# Patient Record
Sex: Female | Born: 1980 | Race: White | Hispanic: No | Marital: Married | Smoking: Never smoker
Health system: Southern US, Community
[De-identification: ages and names within clinical notes are randomized; demographics above are authoritative.]

## PROBLEM LIST (undated history)

## (undated) DIAGNOSIS — E669 Obesity, unspecified: Secondary | ICD-10-CM

## (undated) DIAGNOSIS — IMO0001 Reserved for inherently not codable concepts without codable children: Secondary | ICD-10-CM

## (undated) HISTORY — PX: TUBAL LIGATION: SHX77

## (undated) HISTORY — PX: HERNIA REPAIR: SHX51

## (undated) HISTORY — PX: ABDOMINAL SURGERY: SHX537

## (undated) HISTORY — PX: ABDOMINAL HYSTERECTOMY: SHX81

## (undated) HISTORY — PX: CHOLECYSTECTOMY: SHX55

## (undated) HISTORY — PX: TONSILLECTOMY: SUR1361

---

## 2005-04-19 ENCOUNTER — Emergency Department: Payer: Self-pay | Admitting: Emergency Medicine

## 2005-11-23 ENCOUNTER — Emergency Department: Payer: Self-pay | Admitting: Emergency Medicine

## 2005-11-25 ENCOUNTER — Emergency Department: Payer: Self-pay | Admitting: Internal Medicine

## 2005-11-26 ENCOUNTER — Ambulatory Visit: Payer: Self-pay | Admitting: Internal Medicine

## 2005-11-27 ENCOUNTER — Ambulatory Visit: Payer: Self-pay | Admitting: Internal Medicine

## 2006-01-09 ENCOUNTER — Ambulatory Visit: Payer: Self-pay | Admitting: Obstetrics and Gynecology

## 2006-01-23 ENCOUNTER — Ambulatory Visit: Payer: Self-pay | Admitting: Obstetrics and Gynecology

## 2006-03-13 ENCOUNTER — Emergency Department: Payer: Self-pay | Admitting: Internal Medicine

## 2006-05-12 ENCOUNTER — Observation Stay: Payer: Self-pay | Admitting: Obstetrics and Gynecology

## 2006-05-13 ENCOUNTER — Inpatient Hospital Stay: Payer: Self-pay | Admitting: Obstetrics and Gynecology

## 2006-06-13 ENCOUNTER — Ambulatory Visit: Payer: Self-pay | Admitting: Obstetrics and Gynecology

## 2006-06-14 ENCOUNTER — Ambulatory Visit: Payer: Self-pay | Admitting: Obstetrics and Gynecology

## 2006-06-17 ENCOUNTER — Inpatient Hospital Stay: Payer: Self-pay | Admitting: Obstetrics and Gynecology

## 2007-04-19 ENCOUNTER — Emergency Department: Payer: Self-pay | Admitting: Emergency Medicine

## 2007-07-06 ENCOUNTER — Encounter: Payer: Self-pay | Admitting: Maternal & Fetal Medicine

## 2008-02-22 ENCOUNTER — Other Ambulatory Visit: Payer: Self-pay

## 2008-02-22 ENCOUNTER — Emergency Department: Payer: Self-pay | Admitting: Emergency Medicine

## 2008-04-10 ENCOUNTER — Emergency Department: Payer: Self-pay | Admitting: Emergency Medicine

## 2008-04-11 ENCOUNTER — Ambulatory Visit: Payer: Self-pay | Admitting: Emergency Medicine

## 2009-02-26 ENCOUNTER — Emergency Department: Payer: Self-pay | Admitting: Emergency Medicine

## 2009-04-14 ENCOUNTER — Ambulatory Visit: Payer: Self-pay | Admitting: Urology

## 2013-01-13 ENCOUNTER — Emergency Department: Payer: Self-pay | Admitting: Emergency Medicine

## 2013-01-13 LAB — CBC
HGB: 15.5 g/dL (ref 12.0–16.0)
MCHC: 34.5 g/dL (ref 32.0–36.0)
Platelet: 320 10*3/uL (ref 150–440)
RDW: 13.8 % (ref 11.5–14.5)

## 2013-01-13 LAB — URINALYSIS, COMPLETE
Bilirubin,UR: NEGATIVE
Glucose,UR: NEGATIVE mg/dL (ref 0–75)
Ketone: NEGATIVE
Ph: 5 (ref 4.5–8.0)
Protein: 30
Squamous Epithelial: 20
WBC UR: 42 /HPF (ref 0–5)

## 2013-01-13 LAB — COMPREHENSIVE METABOLIC PANEL
Albumin: 4 g/dL (ref 3.4–5.0)
Alkaline Phosphatase: 90 U/L (ref 50–136)
BUN: 8 mg/dL (ref 7–18)
Bilirubin,Total: 0.3 mg/dL (ref 0.2–1.0)
Calcium, Total: 8.9 mg/dL (ref 8.5–10.1)
Chloride: 108 mmol/L — ABNORMAL HIGH (ref 98–107)
Co2: 29 mmol/L (ref 21–32)
Creatinine: 0.79 mg/dL (ref 0.60–1.30)
EGFR (African American): >60
EGFR (Non-African Amer.): >60
Glucose: 88 mg/dL (ref 65–99)
Sodium: 140 mmol/L (ref 136–145)
Total Protein: 8.1 g/dL (ref 6.4–8.2)

## 2013-01-13 LAB — PREGNANCY, URINE: Pregnancy Test, Urine: NEGATIVE m[IU]/mL

## 2013-02-09 ENCOUNTER — Emergency Department: Payer: Self-pay | Admitting: Emergency Medicine

## 2013-03-25 ENCOUNTER — Emergency Department: Payer: Self-pay | Admitting: Emergency Medicine

## 2013-04-08 ENCOUNTER — Ambulatory Visit: Payer: Self-pay

## 2013-04-14 ENCOUNTER — Emergency Department: Payer: Self-pay | Admitting: Emergency Medicine

## 2013-04-14 LAB — COMPREHENSIVE METABOLIC PANEL
Albumin: 3.7 g/dL (ref 3.4–5.0)
Alkaline Phosphatase: 71 U/L (ref 50–136)
BUN: 14 mg/dL (ref 7–18)
Co2: 27 mmol/L (ref 21–32)
Creatinine: 0.87 mg/dL (ref 0.60–1.30)
EGFR (African American): >60
EGFR (Non-African Amer.): >60
Osmolality: 279 (ref 275–301)
Potassium: 4 mmol/L (ref 3.5–5.1)
SGOT(AST): 16 U/L (ref 15–37)
SGPT (ALT): 25 U/L (ref 12–78)

## 2013-04-14 LAB — CBC
MCHC: 34.2 g/dL (ref 32.0–36.0)
MCV: 88 fL (ref 80–100)
Platelet: 298 10*3/uL (ref 150–440)
RBC: 4.73 10*6/uL (ref 3.80–5.20)
RDW: 14 % (ref 11.5–14.5)

## 2013-04-15 ENCOUNTER — Emergency Department: Payer: Self-pay | Admitting: Emergency Medicine

## 2013-09-03 ENCOUNTER — Emergency Department: Payer: Self-pay | Admitting: Emergency Medicine

## 2013-09-03 LAB — URINALYSIS, COMPLETE
Bilirubin,UR: NEGATIVE
Blood: NEGATIVE
Glucose,UR: NEGATIVE mg/dL (ref 0–75)
Ketone: NEGATIVE
Ph: 6 (ref 4.5–8.0)
Protein: NEGATIVE
RBC,UR: 3 /HPF (ref 0–5)
Specific Gravity: 1.018 (ref 1.003–1.030)
Squamous Epithelial: 5
WBC UR: 7 /HPF (ref 0–5)

## 2013-09-03 LAB — COMPREHENSIVE METABOLIC PANEL
Alkaline Phosphatase: 80 U/L (ref 50–136)
Anion Gap: 2 — ABNORMAL LOW (ref 7–16)
BUN: 10 mg/dL (ref 7–18)
Bilirubin,Total: 0.3 mg/dL (ref 0.2–1.0)
Co2: 30 mmol/L (ref 21–32)
EGFR (African American): >60
EGFR (Non-African Amer.): >60
Glucose: 87 mg/dL (ref 65–99)
Osmolality: 274 (ref 275–301)
SGOT(AST): 18 U/L (ref 15–37)
SGPT (ALT): 25 U/L (ref 12–78)
Sodium: 138 mmol/L (ref 136–145)
Total Protein: 7.5 g/dL (ref 6.4–8.2)

## 2013-09-03 LAB — CBC
HGB: 14.2 g/dL (ref 12.0–16.0)
MCHC: 34.7 g/dL (ref 32.0–36.0)
MCV: 88 fL (ref 80–100)
Platelet: 300 10*3/uL (ref 150–440)
RDW: 13.4 % (ref 11.5–14.5)
WBC: 12.7 10*3/uL — ABNORMAL HIGH (ref 3.6–11.0)

## 2013-11-29 ENCOUNTER — Ambulatory Visit: Payer: Self-pay | Admitting: Physician Assistant

## 2013-11-29 LAB — URINALYSIS, COMPLETE
Ketone: NEGATIVE
Ph: 8 (ref 4.5–8.0)
Protein: 30
Specific Gravity: 1.015 (ref 1.003–1.030)
Squamous Epithelial: >30

## 2014-06-05 ENCOUNTER — Ambulatory Visit: Payer: Self-pay | Admitting: Physician Assistant

## 2014-08-23 ENCOUNTER — Emergency Department: Payer: Self-pay | Admitting: Emergency Medicine

## 2014-08-23 LAB — COMPREHENSIVE METABOLIC PANEL
Albumin: 3.8 g/dL (ref 3.4–5.0)
Alkaline Phosphatase: 68 U/L
Anion Gap: 7 (ref 7–16)
BUN: 8 mg/dL (ref 7–18)
Bilirubin,Total: 0.4 mg/dL (ref 0.2–1.0)
Calcium, Total: 8.8 mg/dL (ref 8.5–10.1)
Chloride: 107 mmol/L (ref 98–107)
Co2: 26 mmol/L (ref 21–32)
Creatinine: 0.93 mg/dL (ref 0.60–1.30)
EGFR (African American): >60
EGFR (Non-African Amer.): >60
Glucose: 94 mg/dL (ref 65–99)
OSMOLALITY: 277 (ref 275–301)
POTASSIUM: 4.1 mmol/L (ref 3.5–5.1)
SGOT(AST): 14 U/L — ABNORMAL LOW (ref 15–37)
SGPT (ALT): 22 U/L
Sodium: 140 mmol/L (ref 136–145)
Total Protein: 7.7 g/dL (ref 6.4–8.2)

## 2014-08-23 LAB — CBC WITH DIFFERENTIAL/PLATELET
Basophil #: 0 10*3/uL (ref 0.0–0.1)
Basophil %: 0.2 %
EOS PCT: 0.6 %
Eosinophil #: 0.1 10*3/uL (ref 0.0–0.7)
HCT: 44 % (ref 35.0–47.0)
HGB: 15 g/dL (ref 12.0–16.0)
LYMPHS ABS: 2.4 10*3/uL (ref 1.0–3.6)
LYMPHS PCT: 22.7 %
MCH: 30.3 pg (ref 26.0–34.0)
MCHC: 34.1 g/dL (ref 32.0–36.0)
MCV: 89 fL (ref 80–100)
Monocyte #: 0.6 x10 3/mm (ref 0.2–0.9)
Monocyte %: 5.8 %
NEUTROS ABS: 7.5 10*3/uL — AB (ref 1.4–6.5)
Neutrophil %: 70.7 %
Platelet: 322 10*3/uL (ref 150–440)
RBC: 4.96 10*6/uL (ref 3.80–5.20)
RDW: 14 % (ref 11.5–14.5)
WBC: 10.5 10*3/uL (ref 3.6–11.0)

## 2014-08-23 LAB — URINALYSIS, COMPLETE
Bacteria: NONE SEEN
Glucose,UR: NEGATIVE mg/dL (ref 0–75)
NITRITE: NEGATIVE
Ph: 5 (ref 4.5–8.0)
Protein: NEGATIVE
RBC,UR: 14 /HPF (ref 0–5)
SPECIFIC GRAVITY: 1.021 (ref 1.003–1.030)
Squamous Epithelial: 37
WBC UR: 33 /HPF (ref 0–5)

## 2015-01-10 ENCOUNTER — Ambulatory Visit: Payer: Self-pay | Admitting: Obstetrics & Gynecology

## 2015-01-10 LAB — CBC
HCT: 42.8 % (ref 35.0–47.0)
HGB: 14.3 g/dL (ref 12.0–16.0)
MCH: 30.4 pg (ref 26.0–34.0)
MCHC: 33.5 g/dL (ref 32.0–36.0)
MCV: 91 fL (ref 80–100)
PLATELETS: 298 10*3/uL (ref 150–440)
RBC: 4.72 10*6/uL (ref 3.80–5.20)
RDW: 13.5 % (ref 11.5–14.5)
WBC: 9.1 10*3/uL (ref 3.6–11.0)

## 2015-01-17 ENCOUNTER — Ambulatory Visit: Payer: Self-pay | Admitting: Obstetrics & Gynecology

## 2015-01-18 LAB — HEMOGLOBIN: HGB: 13.2 g/dL (ref 12.0–16.0)

## 2015-01-27 ENCOUNTER — Ambulatory Visit: Payer: Self-pay | Admitting: Obstetrics & Gynecology

## 2015-04-17 LAB — SURGICAL PATHOLOGY

## 2015-04-23 NOTE — Op Note (Signed)
PATIENT NAME:  Mercedes [name], Mercedes [name] MR#:  [id number] DATE OF BIRTH:  1981-10-07  DATE OF PROCEDURE:  01/17/2015  PREOPERATIVE DIAGNOSIS:  Menometrorrhagia.    POSTOPERATIVE DIAGNOSIS:  Menometrorrhagia.    PROCEDURE:  Laparoscopic supracervical hysterectomy with bilateral salpingectomy.    SURGEON:  Dierdre Searles. Paul Destine Zirkle, MD.    ASSISTANT:  Dr. Vergie LivingPickens.    ANESTHESIA:  General.    ESTIMATED BLOOD LOSS:  25 mL.    COMPLICATIONS:  None.    FINDINGS:  There was a significant amount of adhesions of the bladder to the lower uterine segment and cervix.  The patient had normal ovaries.    DISPOSITION: To recovery in stable condition.    TECHNIQUE:  The patient is prepped and draped in the usual sterile fashion after adequate anesthesia is obtained in the dorsal lithotomy position.  Foley catheter is inserted.  Sponge stick is placed in the vagina for manipulation purposes.    Attention is then turned to the abdomen where a Veress needle is inserted through a 5 mm infraumbilical incision after Marcaine was used to anesthetize the skin. Veress needle placement is confirmed using the hanging drop technique and the abdomen is then insufflated with CO2 gas.  A 5 mm trocar is then inserted under direct visualization with laparoscope with no injuries or bleeding noted.  The patient is placed in Trendelenburg position with the abovementioned findings visualized.    An 11 mm trocar is placed in the right lower quadrant and a 5 mm trocar is placed in the left lower quadrant lateral to the inferior epigastric blood vessels with no injuries or bleeding noted.  There are adhesions along the anterior abdominal wall with the omentum and these are carefully lysed using the 5 mm Harmonic scalpel.  Examination of small bowel and colon reveals no injuries or problems.  The round ligaments are carefully clamped, transected with the 5 mm Harmonic scalpel device to obtain coagulation at the same time.  The anterior and posterior  leaflets of the broad ligament are carefully dissected free down to the level of the uteroovarian blood vessels superiorly with careful transection of this ligament and blood vessels with the 5 mm Harmonic scalpel without any bleeding noted.  The ovary and its blood supply is preserved on each side.  Then dissection is carried down in an inferior fashion, careful dissection to stay above the level of the bladder where it is adherent to the cervix and lower uterine segment is performed.  The uterine arteries are carefully coagulated and cut using the bipolar cautery device.  Once adequate dissection is performed the uterus is amputated with approximately 1-2 cm of cervix left in place.  The endocervical canal is carefully cauterized with both the Harmonic scalpel and the bipolar cautery device.  Excellent hemostasis is noted at the cervical site.    Examination of the pelvis reveals no bleeding.  Ureters are visualized on each side, there are no apparent injuries to bowel, bladder, ureter, or other structures.  The pelvic cavity is irrigated with aspiration of all fluid.  Intercede is placed over the cervical stump.    The right lower quadrant trocar is removed and a morcellator device is place.  The uterus is removed in several pieces using the morcellator device.  Re-exam of the pelvis reveals no pieces of tissue left behind. Pelvic cavity is irrigated again with aspiration of all fluid.  The morcellator is then removed and the fascia at this site is closed with a 0 Vicryl  suture using the fascial closure device.  Gas is then expelled from the abdomen as the patient is leveled and then all trocars are removed.  The skin is closed with Dermabond at all sites.  Sponge stick is removed from the vagina.  The patient goes to recovery room in stable condition.  All sponge, instrument, and needle counts are correct at the conclusion of the case.      ____________________________ R. Annamarie Major,  MD rph:bu D: 01/17/2015 15:33:44 ET T: 01/17/2015 17:15:14 ET JOB#: [id number]  cc: Dierdre Searles, MD, <Dictator> Nadara Mustard MD ELECTRONICALLY SIGNED 01/18/2015 7:42

## 2015-06-03 ENCOUNTER — Encounter: Payer: Self-pay | Admitting: *Deleted

## 2015-06-03 DIAGNOSIS — S6991XA Unspecified injury of right wrist, hand and finger(s), initial encounter: Secondary | ICD-10-CM | POA: Diagnosis not present

## 2015-06-03 DIAGNOSIS — Z90710 Acquired absence of both cervix and uterus: Secondary | ICD-10-CM | POA: Diagnosis not present

## 2015-06-03 DIAGNOSIS — Y92007 Garden or yard of unspecified non-institutional (private) residence as the place of occurrence of the external cause: Secondary | ICD-10-CM | POA: Diagnosis not present

## 2015-06-03 DIAGNOSIS — T148 Other injury of unspecified body region: Secondary | ICD-10-CM | POA: Insufficient documentation

## 2015-06-03 DIAGNOSIS — Y998 Other external cause status: Secondary | ICD-10-CM | POA: Diagnosis not present

## 2015-06-03 DIAGNOSIS — Y93H2 Activity, gardening and landscaping: Secondary | ICD-10-CM | POA: Diagnosis not present

## 2015-06-03 DIAGNOSIS — R1031 Right lower quadrant pain: Secondary | ICD-10-CM | POA: Diagnosis present

## 2015-06-03 DIAGNOSIS — X58XXXA Exposure to other specified factors, initial encounter: Secondary | ICD-10-CM | POA: Diagnosis not present

## 2015-06-03 NOTE — ED Notes (Signed)
Pt c/o RLQ abdominal pain worsening tonight while mowing. Pt states she felt "something pop" in RLQ. Pt has recent hx of abdominal pain related to moving a couch x 3 days ago but did not get bad enough for her to be seen after she pulled on the mower tonight. Pt denies n/v/d and dysuria. Pt c/o tenderness to palpation in RLQ. Pt has hx of partial hysterectomy in past 6 months. Pt denies vaginal bleeding at this time.

## 2015-06-04 ENCOUNTER — Emergency Department: Payer: Medicaid Other

## 2015-06-04 ENCOUNTER — Emergency Department
Admission: EM | Admit: 2015-06-04 | Discharge: 2015-06-04 | Disposition: A | Discharge status: Home / Self Care | Payer: Medicaid Other | Attending: Student | Admitting: Student

## 2015-06-04 DIAGNOSIS — R1031 Right lower quadrant pain: Secondary | ICD-10-CM

## 2015-06-04 DIAGNOSIS — T148XXA Other injury of unspecified body region, initial encounter: Secondary | ICD-10-CM

## 2015-06-04 LAB — CBC WITH DIFFERENTIAL/PLATELET
BASOS PCT: 0 %
Basophils Absolute: 0 10*3/uL (ref 0–0.1)
Eosinophils Absolute: 0.1 10*3/uL (ref 0–0.7)
Eosinophils Relative: 1 %
HEMATOCRIT: 42.3 % (ref 35.0–47.0)
HEMOGLOBIN: 14.3 g/dL (ref 12.0–16.0)
LYMPHS ABS: 3.1 10*3/uL (ref 1.0–3.6)
Lymphocytes Relative: 26 %
MCH: 29.8 pg (ref 26.0–34.0)
MCHC: 33.8 g/dL (ref 32.0–36.0)
MCV: 88.3 fL (ref 80.0–100.0)
MONO ABS: 0.8 10*3/uL (ref 0.2–0.9)
Monocytes Relative: 7 %
NEUTROS ABS: 7.9 10*3/uL — AB (ref 1.4–6.5)
Neutrophils Relative %: 66 %
Platelets: 325 10*3/uL (ref 150–440)
RBC: 4.79 MIL/uL (ref 3.80–5.20)
RDW: 14.4 % (ref 11.5–14.5)
WBC: 11.9 10*3/uL — AB (ref 3.6–11.0)

## 2015-06-04 LAB — COMPREHENSIVE METABOLIC PANEL
ALK PHOS: 69 U/L (ref 38–126)
ALT: 18 U/L (ref 14–54)
AST: 12 U/L — AB (ref 15–41)
Albumin: 4 g/dL (ref 3.5–5.0)
Anion gap: 7 (ref 5–15)
BUN: 13 mg/dL (ref 6–20)
CO2: 28 mmol/L (ref 22–32)
Calcium: 9 mg/dL (ref 8.9–10.3)
Chloride: 104 mmol/L (ref 101–111)
Creatinine, Ser: 0.85 mg/dL (ref 0.44–1.00)
GFR calc Af Amer: >60 mL/min (ref >60)
GFR calc non Af Amer: >60 mL/min (ref >60)
GLUCOSE: 90 mg/dL (ref 65–99)
Potassium: 3.7 mmol/L (ref 3.5–5.1)
SODIUM: 139 mmol/L (ref 135–145)
Total Bilirubin: 0.3 mg/dL (ref 0.3–1.2)
Total Protein: 7.4 g/dL (ref 6.5–8.1)

## 2015-06-04 LAB — URINALYSIS COMPLETE WITH MICROSCOPIC (ARMC ONLY)
Bilirubin Urine: NEGATIVE
GLUCOSE, UA: NEGATIVE mg/dL
Ketones, ur: NEGATIVE mg/dL
Leukocytes, UA: NEGATIVE
NITRITE: NEGATIVE
PH: 5 (ref 5.0–8.0)
Protein, ur: NEGATIVE mg/dL
Specific Gravity, Urine: 1.03 (ref 1.005–1.030)

## 2015-06-04 LAB — LIPASE, BLOOD: LIPASE: 37 U/L (ref 22–51)

## 2015-06-04 LAB — PREGNANCY, URINE: PREG TEST UR: NEGATIVE

## 2015-06-04 MED ORDER — ONDANSETRON HCL 4 MG/2ML IJ SOLN
4.0000 mg | Freq: Once | INTRAMUSCULAR | Status: AC
  Administered 2015-06-04: 4 mg via INTRAVENOUS

## 2015-06-04 MED ORDER — IBUPROFEN 600 MG PO TABS: 600.0000 mg | ORAL_TABLET | Freq: Four times a day (QID) | ORAL | Status: AC | PRN

## 2015-06-04 MED ORDER — IOHEXOL 300 MG/ML  SOLN
125.0000 mL | Freq: Once | INTRAMUSCULAR | Status: AC | PRN
  Administered 2015-06-04: 125 mL via INTRAVENOUS

## 2015-06-04 MED ORDER — MORPHINE SULFATE 4 MG/ML IJ SOLN
INTRAMUSCULAR | Status: AC
  Administered 2015-06-04: 4 mg via INTRAVENOUS
  Filled 2015-06-04: qty 1

## 2015-06-04 MED ORDER — MORPHINE SULFATE 4 MG/ML IJ SOLN
4.0000 mg | Freq: Once | INTRAMUSCULAR | Status: AC
Start: 2015-06-04 — End: 2015-06-04
  Administered 2015-06-04: 4 mg via INTRAVENOUS

## 2015-06-04 MED ORDER — SODIUM CHLORIDE 0.9 % IV BOLUS (SEPSIS)
1000.0000 mL | Freq: Once | INTRAVENOUS | Status: AC
  Administered 2015-06-04: 1000 mL via INTRAVENOUS

## 2015-06-04 MED ORDER — IOHEXOL 240 MG/ML SOLN
25.0000 mL | Freq: Once | INTRAMUSCULAR | Status: AC | PRN
  Administered 2015-06-04: 25 mL via ORAL

## 2015-06-04 MED ORDER — ONDANSETRON HCL 4 MG/2ML IJ SOLN
INTRAMUSCULAR | Status: AC
  Administered 2015-06-04: 4 mg via INTRAVENOUS
  Filled 2015-06-04: qty 2

## 2015-06-04 NOTE — ED Provider Notes (Signed)
[facility] Emergency Department Provider Note  ____________________________________________  Time seen: Approximately 2:32 AM  I have reviewed the triage vital signs and the nursing notes.   HISTORY  Chief Complaint Abdominal Pain    HPI Mercedes [name] Route is a 34 y.o. female with history of GERD, multiple abdominal operations including hernia repair, cholecystectomy, partial hysterectomy presents for evaluation of 3 days sudden onset constant worsening right lower quadrant pain. She describes the pain as a dull ache but sometimes it also feels "like a stabbing pain". She reports that it began 3 days ago when she was attmpting to move a couch. She thought she "felt a pop". Yesterday she felt that again while she was pulling a lawnmower. She also reports that she twisted her wrist  While doing yard work and is having right wrist pain. She's had nausea but no vomiting, no diarrhea, no fevers or chills. She continues to pass flatus. Current pain severity is moderate. Palpation makes the pain worse.   Past Medical History  Diagnosis Date  . Acid reflux     There are no active problems to display for this patient.   Past Surgical History  Procedure Laterality Date  . Abdominal hysterectomy      No current outpatient prescriptions on file.  Allergies Gabapentin and Macrobid  History reviewed. No pertinent family history.  Social History History  Substance Use Topics  . Smoking status: Never Smoker   . Smokeless tobacco: Never Used  . Alcohol Use: No    Review of Systems Constitutional: No fever/chills Eyes: No visual changes. ENT: No sore throat. Cardiovascular: Denies chest pain. Respiratory: Denies shortness of breath. Gastrointestinal: + abdominal pain.  + nausea, no vomiting.  No diarrhea.  No constipation. Genitourinary: Negative for dysuria. Musculoskeletal: Negative for back pain. Skin: Negative for rash. Neurological: Negative for  headaches, focal weakness or numbness.  10-point ROS otherwise negative.  ____________________________________________   PHYSICAL EXAM:  VITAL SIGNS: ED Triage Vitals  Enc Vitals Group     BP 06/03/15 2324 150/88 mmHg     Pulse Rate 06/03/15 2324 77     Resp 06/03/15 2324 20     Temp 06/03/15 2324 98.1 F (36.7 C)     Temp Source 06/03/15 2324 Oral     SpO2 06/03/15 2324 100 %     Weight 06/03/15 2324 280 lb (127.007 kg)     Height 06/03/15 2324  (1.626 m)     Head Cir --      Peak Flow --      Pain Score 06/03/15 2341 7     Pain Loc --      Pain Edu? --      Excl. in GC? --     Constitutional: Alert and oriented. Well appearing and in no acute distress. Eyes: Conjunctivae are normal. PERRL. EOMI. Head: Atraumatic. Nose: No congestion/rhinnorhea. Mouth/Throat: Mucous membranes are moist.  Oropharynx non-erythematous. Neck: No stridor.   Cardiovascular: Normal rate, regular rhythm. Grossly normal heart sounds.  Good peripheral circulation. Respiratory: Normal respiratory effort.  No retractions. Lungs CTAB. Gastrointestinal: Soft with moderate right lower quadrant tenderness to palpation. No distention. No abdominal bruits. No CVA tenderness. Genitourinary: deferred Musculoskeletal: No lower extremity tenderness nor edema.  No joint effusions. Neurologic:  Normal speech and language. No gross focal neurologic deficits are appreciated. Speech is normal. No gait instability. Skin:  Skin is warm, dry and intact. No rash noted. Psychiatric: Mood and affect are normal. Speech and behavior  are normal.  ____________________________________________   LABS (all labs ordered are listed, but only abnormal results are displayed)  Labs Reviewed  CBC WITH DIFFERENTIAL/PLATELET - Abnormal; Notable for the following:    WBC 11.9 (*)    Neutro Abs 7.9 (*)    All other components within normal limits  COMPREHENSIVE METABOLIC PANEL - Abnormal; Notable for the following:    AST  12 (*)    All other components within normal limits  URINALYSIS COMPLETEWITH MICROSCOPIC ([facility] ONLY) - Abnormal; Notable for the following:    Color, Urine YELLOW (*)    APPearance CLEAR (*)    Hgb urine dipstick 1+ (*)    Bacteria, UA RARE (*)    Squamous Epithelial / LPF 0-5 (*)    All other components within normal limits  LIPASE, BLOOD  PREGNANCY, URINE   ____________________________________________  EKG  none ____________________________________________  RADIOLOGY  CT abdomen and Pelvis  IMPRESSION: 1. No acute findings are evident. 2. Benign unchanged hepatic cyst 3. Nephrolithiasis 4. Normal appendix 5. Cholecystectomy and hysterectomy  ____________________________________________   PROCEDURES  Procedure(s) performed: None  Critical Care performed: No  ____________________________________________   INITIAL IMPRESSION / ASSESSMENT AND PLAN / ED COURSE  Pertinent labs & imaging results that were available during my care of the patient were reviewed by me and considered in my medical decision making (see chart for details).  Mercedes [name] is a 34 y.o. female with history of GERD, multiple abdominal operations presents for evaluation of 3 days sudden onset constant worsening right lower quadrant pain. On exam, she is generally well-appearing and in no acute distress. Vital signs stable, she is afebrile. She does have moderate right lower quadrant tenderness with a mild elevation in her leukocytes and CT of the abdomen and pelvis was obtained to evaluate the appendix. CT shows normal appendix, no acute intra-abdominal pathology which would explain her pain. She has mild nonspecific hematuria which will require follow-up though it may be related to her nonobstructing small bilateral renal calculi.Suspect muscle strain. Not consistent with acute ovarian torsion. Pain improved. Remainder of her labs are unremarkable. DC home with symptomatic care, return precautions,  close PCP follow-up she is comfortable with discharge. ____________________________________________   FINAL CLINICAL IMPRESSION(S) / ED DIAGNOSES  Final diagnoses:  RLQ abdominal pain      Gayla Doss, MD 06/04/15 [id number]

## 2015-06-04 NOTE — Discharge Instructions (Signed)
You were seen in the emergency department for right-sided abdominal pain which is likely due to a muscle strain. We found a very small amount of blood when we looked at your urine under the microscope. This may resolve on its own but needs to be rechecked in 1 week by your primary care doctor. Return to the emergency department if you develop severe or worsening abdominal pain, blood in vomit or stool, recurrent vomiting, fevers, chest pain, difficulty breathing, inability to have a bowel movement, frank blood in your urine, or for any other concerns.

## 2015-06-04 NOTE — ED Notes (Signed)
Pt reports pulling a muscle in her stomach a week ago. Today pt fell while mowing nd felt a "pop" in her abdomen. Pt also reports having wrist pain. Pt in no acute distress.

## 2015-07-18 ENCOUNTER — Emergency Department
Admission: EM | Admit: 2015-07-18 | Discharge: 2015-07-18 | Disposition: A | Discharge status: Home / Self Care | Payer: Medicaid Other | Attending: Emergency Medicine | Admitting: Emergency Medicine

## 2015-07-18 ENCOUNTER — Ambulatory Visit
Admission: EM | Admit: 2015-07-18 | Discharge: 2015-07-18 | Payer: Medicaid Other | Attending: Internal Medicine | Admitting: Internal Medicine

## 2015-07-18 ENCOUNTER — Emergency Department: Payer: Medicaid Other

## 2015-07-18 ENCOUNTER — Encounter: Payer: Self-pay | Admitting: Emergency Medicine

## 2015-07-18 DIAGNOSIS — K529 Noninfective gastroenteritis and colitis, unspecified: Secondary | ICD-10-CM | POA: Insufficient documentation

## 2015-07-18 DIAGNOSIS — N938 Other specified abnormal uterine and vaginal bleeding: Secondary | ICD-10-CM | POA: Diagnosis present

## 2015-07-18 DIAGNOSIS — N888 Other specified noninflammatory disorders of cervix uteri: Secondary | ICD-10-CM

## 2015-07-18 DIAGNOSIS — R1031 Right lower quadrant pain: Secondary | ICD-10-CM

## 2015-07-18 DIAGNOSIS — Z90710 Acquired absence of both cervix and uterus: Secondary | ICD-10-CM | POA: Insufficient documentation

## 2015-07-18 DIAGNOSIS — N939 Abnormal uterine and vaginal bleeding, unspecified: Secondary | ICD-10-CM

## 2015-07-18 LAB — CBC WITH DIFFERENTIAL/PLATELET
BASOS PCT: 0 %
Basophils Absolute: 0 10*3/uL (ref 0–0.1)
Eosinophils Absolute: 0.1 10*3/uL (ref 0–0.7)
Eosinophils Relative: 1 %
HCT: 42.2 % (ref 35.0–47.0)
Hemoglobin: 14.4 g/dL (ref 12.0–16.0)
Lymphocytes Relative: 24 %
Lymphs Abs: 2.9 10*3/uL (ref 1.0–3.6)
MCH: 29.8 pg (ref 26.0–34.0)
MCHC: 34.1 g/dL (ref 32.0–36.0)
MCV: 87.6 fL (ref 80.0–100.0)
Monocytes Absolute: 0.7 10*3/uL (ref 0.2–0.9)
Monocytes Relative: 6 %
NEUTROS ABS: 8.3 10*3/uL — AB (ref 1.4–6.5)
Neutrophils Relative %: 69 %
Platelets: 295 10*3/uL (ref 150–440)
RBC: 4.82 MIL/uL (ref 3.80–5.20)
RDW: 13.8 % (ref 11.5–14.5)
WBC: 11.9 10*3/uL — AB (ref 3.6–11.0)

## 2015-07-18 LAB — COMPREHENSIVE METABOLIC PANEL
ALBUMIN: 3.9 g/dL (ref 3.5–5.0)
ALT: 17 U/L (ref 14–54)
ANION GAP: 8 (ref 5–15)
AST: 16 U/L (ref 15–41)
Alkaline Phosphatase: 66 U/L (ref 38–126)
BILIRUBIN TOTAL: 0.3 mg/dL (ref 0.3–1.2)
BUN: 11 mg/dL (ref 6–20)
CHLORIDE: 102 mmol/L (ref 101–111)
CO2: 26 mmol/L (ref 22–32)
CREATININE: 0.78 mg/dL (ref 0.44–1.00)
Calcium: 8.5 mg/dL — ABNORMAL LOW (ref 8.9–10.3)
GFR calc Af Amer: >60 mL/min (ref >60)
GFR calc non Af Amer: >60 mL/min (ref >60)
Glucose, Bld: 100 mg/dL — ABNORMAL HIGH (ref 65–99)
POTASSIUM: 3.9 mmol/L (ref 3.5–5.1)
SODIUM: 136 mmol/L (ref 135–145)
Total Protein: 7.4 g/dL (ref 6.5–8.1)

## 2015-07-18 LAB — URINALYSIS COMPLETE WITH MICROSCOPIC (ARMC ONLY)
BILIRUBIN URINE: NEGATIVE
Bacteria, UA: NONE SEEN
GLUCOSE, UA: NEGATIVE mg/dL
Ketones, ur: NEGATIVE mg/dL
Leukocytes, UA: NEGATIVE
NITRITE: NEGATIVE
PROTEIN: NEGATIVE mg/dL
SPECIFIC GRAVITY, URINE: 1.028 (ref 1.005–1.030)
pH: 5 (ref 5.0–8.0)

## 2015-07-18 MED ORDER — IOHEXOL 350 MG/ML SOLN
100.0000 mL | Freq: Once | INTRAVENOUS | Status: AC | PRN
  Administered 2015-07-18: 100 mL via INTRAVENOUS
  Filled 2015-07-18: qty 100

## 2015-07-18 MED ORDER — KETOROLAC TROMETHAMINE 60 MG/2ML IM SOLN
60.0000 mg | Freq: Once | INTRAMUSCULAR | Status: AC
  Administered 2015-07-18: 60 mg via INTRAMUSCULAR

## 2015-07-18 MED ORDER — MORPHINE SULFATE 4 MG/ML IJ SOLN
4.0000 mg | Freq: Once | INTRAMUSCULAR | Status: AC
  Administered 2015-07-18: 4 mg via INTRAVENOUS
  Filled 2015-07-18: qty 1

## 2015-07-18 MED ORDER — DICYCLOMINE HCL 20 MG PO TABS: 20.0000 mg | ORAL_TABLET | Freq: Three times a day (TID) | ORAL | Status: AC | PRN

## 2015-07-18 MED ORDER — ONDANSETRON HCL 4 MG PO TABS: 4.0000 mg | ORAL_TABLET | Freq: Every day | ORAL | Status: AC | PRN

## 2015-07-18 MED ORDER — IOHEXOL 240 MG/ML SOLN
25.0000 mL | Freq: Once | INTRAMUSCULAR | Status: AC | PRN
  Administered 2015-07-18: 25 mL via ORAL

## 2015-07-18 MED ORDER — SODIUM CHLORIDE 0.9 % IV SOLN
Freq: Once | INTRAVENOUS | Status: AC
  Administered 2015-07-18: 21:00:00 via INTRAVENOUS

## 2015-07-18 MED ORDER — ONDANSETRON HCL 4 MG/2ML IJ SOLN
4.0000 mg | Freq: Once | INTRAMUSCULAR | Status: AC
  Administered 2015-07-18: 4 mg via INTRAVENOUS
  Filled 2015-07-18: qty 2

## 2015-07-18 NOTE — Discharge Instructions (Signed)
Please go to the Emergency Room now for further evaluation of right lower quadrant abdominal pain.

## 2015-07-18 NOTE — Discharge Instructions (Signed)
Abdominal Pain °Many things can cause abdominal pain. Usually, abdominal pain is not caused by a disease and will improve without treatment. It can often be observed and treated at home. Your health care provider will do a physical exam and possibly order blood tests and X-rays to help determine the seriousness of your pain. However, in many cases, more time must pass before a clear cause of the pain can be found. Before that point, your health care provider may not know if you need more testing or further treatment. °HOME CARE INSTRUCTIONS  °Monitor your abdominal pain for any changes. The following actions may help to alleviate any discomfort you are experiencing: °· Only take over-the-counter or prescription medicines as directed by your health care provider. °· Do not take laxatives unless directed to do so by your health care provider. °· Try a clear liquid diet (broth, tea, or water) as directed by your health care provider. Slowly move to a bland diet as tolerated. °SEEK MEDICAL CARE IF: °· You have unexplained abdominal pain. °· You have abdominal pain associated with nausea or diarrhea. °· You have pain when you urinate or have a bowel movement. °· You experience abdominal pain that wakes you in the night. °· You have abdominal pain that is worsened or improved by eating food. °· You have abdominal pain that is worsened with eating fatty foods. °· You have a fever. °SEEK IMMEDIATE MEDICAL CARE IF:  °· Your pain does not go away within 2 hours. °· You keep throwing up (vomiting). °· Your pain is felt only in portions of the abdomen, such as the right side or the left lower portion of the abdomen. °· You pass bloody or black tarry stools. °MAKE SURE YOU: °· Understand these instructions.   °· Will watch your condition.   °· Will get help right away if you are not doing well or get worse.   °Document Released: 09/18/2005 Document Revised: 12/14/2013 Document Reviewed: 08/18/2013 °[employer]® Patient Information  ©2015 [employer]. This information is not intended to replace advice given to you by your health care provider. Make sure you discuss any questions you have with your health care provider. ° °Viral Gastroenteritis °Viral gastroenteritis is also called stomach flu. This illness is caused by a certain type of germ (virus). It can cause sudden watery poop (diarrhea) and throwing up (vomiting). This can cause you to lose body fluids (dehydration). This illness usually lasts for 3 to 8 days. It usually goes away on its own. °HOME CARE  °· Drink enough fluids to keep your pee (urine) clear or pale yellow. Drink small amounts of fluids often. °· Ask your doctor how to replace body fluid losses (rehydration). °· Avoid: °¨ Foods high in sugar. °¨ Alcohol. °¨ Bubbly (carbonated) drinks. °¨ Tobacco. °¨ Juice. °¨ Caffeine drinks. °¨ Very hot or cold fluids. °¨ Fatty, greasy foods. °¨ Eating too much at one time. °¨ Dairy products until 24 to 48 hours after your watery poop stops. °· You may eat foods with active cultures (probiotics). They can be found in some yogurts and supplements. °· Wash your hands well to avoid spreading the illness. °· Only take medicines as told by your doctor. Do not give aspirin to children. Do not take medicines for watery poop (antidiarrheals). °· Ask your doctor if you should keep taking your regular medicines. °· Keep all doctor visits as told. °GET HELP RIGHT AWAY IF:  °· You cannot keep fluids down. °· You do not pee at least once   every 6 to 8 hours. °· You are short of breath. °· You see blood in your poop or throw up. This may look like coffee grounds. °· You have belly (abdominal) pain that gets worse or is just in one small spot (localized). °· You keep throwing up or having watery poop. °· You have a fever. °· The patient is a child younger than 3 months, and he or she has a fever. °· The patient is a child older than 3 months, and he or she has a fever and problems that do not go  away. °· The patient is a child older than 3 months, and he or she has a fever and problems that suddenly get worse. °· The patient is a baby, and he or she has no tears when crying. °MAKE SURE YOU:  °· Understand these instructions. °· Will watch your condition. °· Will get help right away if you are not doing well or get worse. °Document Released: 05/27/2008 Document Revised: 03/02/2012 Document Reviewed: 09/25/2011 °ExitCare® Patient Information ©2015 [employer]. This information is not intended to replace advice given to you by your health care provider. Make sure you discuss any questions you have with your health care provider. ° °

## 2015-07-18 NOTE — ED Provider Notes (Signed)
[facility] Emergency Department Provider Note     Time seen: ----------------------------------------- 8:12 PM on 07/18/2015 -----------------------------------------    I have reviewed the triage vital signs and the nursing notes.   HISTORY  Chief Complaint Vaginal Bleeding and Abdominal Pain    HPI Mercedes [name] is a 34 y.o. female who presents to ER with right lower quadrant pain and vaginal bleeding. She states she's been expressing vaginal bleeding for 3 days and abdominal pain for 4 days. She states it feels like when she had a hernia. Sharp stabbing pain, does not feel like a kidney stone, has had some nausea and diarrhea. Pain is worse with movement or straining. She was seen a [place] urgent care and sent here for further evaluation.   Past Medical History  Diagnosis Date  . Acid reflux     There are no active problems to display for this patient.   Past Surgical History  Procedure Laterality Date  . Abdominal hysterectomy    . Cesarean section      x 2  . Tubal ligation    . Cholecystectomy    . Tonsillectomy    . Hernia repair      Allergies Gabapentin and Macrobid  Social History History  Substance Use Topics  . Smoking status: Never Smoker   . Smokeless tobacco: Never Used  . Alcohol Use: No    Review of Systems Constitutional: Negative for fever. Eyes: Negative for visual changes. ENT: Negative for sore throat. Cardiovascular: Negative for chest pain. Respiratory: Negative for shortness of breath. Gastrointestinal: Positive for abdominal pain and diarrhea Genitourinary: Negative for dysuria. Positive vaginal bleeding post-hysterectomy Musculoskeletal: Negative for back pain. Skin: Negative for rash. Neurological: Negative for headaches, focal weakness or numbness.  10-point ROS otherwise negative.  ____________________________________________   PHYSICAL EXAM:  VITAL SIGNS: ED Triage Vitals  Enc Vitals  Group     BP 07/18/15 1957 155/107 mmHg     Pulse Rate 07/18/15 1957 83     Resp 07/18/15 1957 18     Temp 07/18/15 1957 98.1 F (36.7 C)     Temp Source 07/18/15 1957 Oral     SpO2 07/18/15 1953 98 %     Weight 07/18/15 1957 280 lb (127.007 kg)     Height 07/18/15 1957 5\' 5"  (1.651 m)     Head Cir --      Peak Flow --      Pain Score 07/18/15 1958 6     Pain Loc --      Pain Edu? --      Excl. in GC? --     Constitutional: Alert and oriented. Well appearing and in no distress. Eyes: Conjunctivae are normal. PERRL. Normal extraocular movements. ENT   Head: Normocephalic and atraumatic.   Nose: No congestion/rhinnorhea.   Mouth/Throat: Mucous membranes are moist.   Neck: No stridor. Cardiovascular: Normal rate, regular rhythm. Normal and symmetric distal pulses are present in all extremities. No murmurs, rubs, or gallops. Respiratory: Normal respiratory effort without tachypnea nor retractions. Breath sounds are clear and equal bilaterally. No wheezes/rales/rhonchi. Gastrointestinal: Right lower quadrant tenderness, no rebound or guarding. Normal bowel sounds. Musculoskeletal: Nontender with normal range of motion in all extremities. No joint effusions.  No lower extremity tenderness nor edema. Neurologic:  Normal speech and language. No gross focal neurologic deficits are appreciated. Speech is normal. No gait instability. Skin:  Skin is warm, dry and intact. No rash noted. Psychiatric: Mood and affect are normal.  Speech and behavior are normal. Patient exhibits appropriate insight and judgment.  ____________________________________________  ED COURSE:  Pertinent labs & imaging results that were available during my care of the patient were reviewed by me and considered in my medical decision making (see chart for details). will perform CT scan. Patient also received saline, morphine and Zofran. ____________________________________________    LABS (pertinent  positives/negatives) Labs Reviewed  CBC WITH DIFFERENTIAL/PLATELET - Abnormal; Notable for the following:    WBC 11.9 (*)    Neutro Abs 8.3 (*)    All other components within normal limits  COMPREHENSIVE METABOLIC PANEL - Abnormal; Notable for the following:    Glucose, Bld 100 (*)    Calcium 8.5 (*)    All other components within normal limits  URINALYSIS COMPLETEWITH MICROSCOPIC ([facility] ONLY) - Abnormal; Notable for the following:    Color, Urine YELLOW (*)    APPearance CLEAR (*)    Hgb urine dipstick 1+ (*)    Squamous Epithelial / LPF 0-5 (*)    All other components within normal limits    RADIOLOGY Images were viewed by me  CT abdomen and pelvis  IMPRESSION: No acute findings. No abnormalities to explain the patient's symptoms. ____________________________________________  FINAL ASSESSMENT AND PLAN  Abdominal pain, vaginal bleeding  Plan: Patient with labs and imaging as dictated above. Bleeding must be coming from the cervix. CT scan is normal. Possible gastroenteritis component. Stable for discharge with nausea medication and Bentyl.   Emily Filbert, MD   Emily Filbert, MD 07/18/15 [phone]

## 2015-07-18 NOTE — ED Provider Notes (Signed)
CSN: [id number]     Arrival date & time 07/18/15  1705 History   First MD Initiated Contact with Patient 07/18/15 1757     Chief Complaint  Patient presents with  . Abdominal Pain  . Vaginal Bleeding   HPI Patient is a 34 year old lady status post hysterectomy for menometro rrhagia in February 2016. Cervix and ovaries were retained. A month ago, she fell down hill trying to grab the lawnmower, and had the onset of intermittent sharp right lower quadrant discomfort. In the last 4 days, she has had constant, fairly severe, stabbing right lower quadrant pain, with the onset of vaginal bleeding 2 days ago. The bleeding is bright red. No change in bowel habits, she typically has soft/loose stools once to twice a day, since her cholecystectomy. Appetite is ok, some nausea today, no vomiting. No fever, no dyspnea, no chest pain. No urinary symptoms, no dysuria, no frequency.  Past Medical History  Diagnosis Date  . Acid reflux    Past Surgical History  Procedure Laterality Date  . Abdominal hysterectomy    . Cesarean section      x 2  . Tubal ligation    . Cholecystectomy    . Tonsillectomy    . Hernia repair      History  Substance Use Topics  . Smoking status: Never Smoker   . Smokeless tobacco: Never Used  . Alcohol Use: No   Review of Systems  All other systems reviewed and are negative.   Allergies  Gabapentin and Macrobid  Home Medications   Prior to Admission medications   Medication Sig Start Date End Date Taking? Authorizing Provider  ibuprofen (ADVIL,MOTRIN) 200 MG tablet Take 800 mg by mouth every 6 (six) hours as needed.   Yes Historical Provider, MD  ibuprofen (ADVIL,MOTRIN) 600 MG tablet Take 1 tablet (600 mg total) by mouth every 6 (six) hours as needed for moderate pain. 06/04/15   Gayla Doss, MD   BP 142/95 mmHg  Pulse 85  Temp(Src) 98 F (36.7 C) (Oral)  Resp 18  Ht 5\' 5"  (1.651 m)  Wt 280 lb (127.007 kg)  BMI 46.59 kg/m2  SpO2 100% Physical Exam   Constitutional: She is oriented to person, place, and time. No distress.  Alert, nicely groomed Looks ill/uncomfortable but not toxic  HENT:  Head: Atraumatic.  Eyes:  Conjugate gaze, no eye redness/drainage  Neck: Neck supple.  Cardiovascular: Normal rate and regular rhythm.   HR 100s  Pulmonary/Chest: No respiratory distress. She has no wheezes. She has no rales.  Lungs clear, symmetric breath sounds  Abdominal: Soft. She exhibits no distension. There is tenderness. There is no rebound and no guarding.  Reproducible marked tenderness in RLQ Marked discomfort with position change (lying to sitting, sitting to lying)  Genitourinary:  Vag mucosa unremarkable Cervical stump with bright red blood at os Equivocal CMT No L adnexal tenderness Equivocal/mild R adnexal tenderness  Musculoskeletal: Normal range of motion.  No leg swelling  Neurological: She is alert and oriented to person, place, and time.  Skin: Skin is warm and dry.  No cyanosis  Nursing note and vitals reviewed.   ED Course  Procedures  Medications  ketorolac (TORADOL) injection 60 mg (60 mg Intramuscular Given 07/18/15 1848)   Discussed patient with Dr Almon Hercules Ob/Gyn, who advised that occ cervical stumps bleed, may not be part of RLQ pain.  Differential of RLQ pain/tenderness is ovarian cyst with intermittent torsion, vs appendicitis.  Will move patient to  ED to expedite further evaluation; [facility]   MDM   1. Acute right lower quadrant pain   2. Bleeding of cervix        Eustace Moore, MD 07/18/15 1859

## 2015-07-18 NOTE — ED Notes (Signed)
Pt reports she was mowing her lawn about 1 month ago, and tripped and fell. Pt has since been experiencing RLQ stabbing pain. Pt reports the pain started a "pulled muscle" sensation, and was intermittent. The pain has been worsening for the last 4 days. Pt also noticed vaginal bleeding for the last 3 days. Pt has a h/o hysterectomy (01/2015).

## 2015-07-18 NOTE — ED Notes (Signed)
Pt arrive to the ED from [facility] after being told to comme to the ED for a CT scan. Pt states that she has been experiencing vaginal bleeding 3 days ago and abdominal pain 4 days ago. Pt is AOx4 in mild pain distress.

## 2015-09-12 ENCOUNTER — Encounter
Admission: RE | Admit: 2015-09-12 | Discharge: 2015-09-12 | Disposition: A | Discharge status: Home / Self Care | Payer: Medicaid Other | Source: Ambulatory Visit | Attending: Obstetrics & Gynecology | Admitting: Obstetrics & Gynecology

## 2015-09-12 DIAGNOSIS — Z01812 Encounter for preprocedural laboratory examination: Secondary | ICD-10-CM | POA: Insufficient documentation

## 2015-09-12 LAB — CBC
HEMATOCRIT: 43 % (ref 35.0–47.0)
Hemoglobin: 14.5 g/dL (ref 12.0–16.0)
MCH: 29.6 pg (ref 26.0–34.0)
MCHC: 33.7 g/dL (ref 32.0–36.0)
MCV: 87.7 fL (ref 80.0–100.0)
Platelets: 294 10*3/uL (ref 150–440)
RBC: 4.91 MIL/uL (ref 3.80–5.20)
RDW: 13.9 % (ref 11.5–14.5)
WBC: 8.5 10*3/uL (ref 3.6–11.0)

## 2015-09-12 LAB — TYPE AND SCREEN
ABO/RH(D): A NEG
Antibody Screen: NEGATIVE

## 2015-09-12 LAB — ABO/RH: ABO/RH(D): A NEG

## 2015-09-12 NOTE — Patient Instructions (Signed)
  Your procedure is scheduled on: Thursday Sept. 22, 2016. Report to Same Day Surgery. To find out your arrival time please call [phone] on Wednesday Sept. 23, 2016 .  Remember: Instructions that are not followed completely may result in serious medical risk, up to and including death, or upon the discretion of your surgeon and anesthesiologist your surgery may need to be rescheduled.    __x__ 1. Do not eat food or drink liquids after midnight. No gum chewing or hard candies.     ____ 2. No Alcohol for 24 hours before or after surgery.   ____ 3. Bring all medications with you on the day of surgery if instructed.    _x___ 4. Notify your doctor if there is any change in your medical condition     (cold, fever, infections).     Do not wear jewelry, make-up, hairpins, clips or nail polish.  Do not wear lotions, powders, or perfumes. You may wear deodorant.  Do not shave 48 hours prior to surgery. Men may shave face and neck.  Do not bring valuables to the hospital.    [facility] is not responsible for any belongings or valuables.               Contacts, dentures or bridgework may not be worn into surgery.  Leave your suitcase in the car. After surgery it may be brought to your room.  For patients admitted to the hospital, discharge time is determined by your treatment team.   Patients discharged the day of surgery will not be allowed to drive home.    Please read over the following fact sheets that you were given:   [facility] Preparing for Surgery  ____ Take these medicines the morning of surgery with A SIP OF WATER: None   ____ Fleet Enema (as directed)   _x___ Use CHG Soap as directed  ____ Use inhalers on the day of surgery  ____ Stop metformin 2 days prior to surgery    ____ Take 1/2 of usual insulin dose the night before surgery and none on the morning of surgery.   ____ Stop Coumadin/Plavix/aspirin on does not apply.  _x___ Stop Anti-inflammatories like  Ibuprofen now. May take Tylenol for pain.  ____ Stop supplements until after surgery.    ____ Bring C-Pap to the hospital.

## 2015-09-14 ENCOUNTER — Ambulatory Visit: Payer: Medicaid Other | Admitting: Certified Registered Nurse Anesthetist

## 2015-09-14 ENCOUNTER — Encounter: Payer: Self-pay | Admitting: *Deleted

## 2015-09-14 ENCOUNTER — Encounter
Admission: RE | Disposition: A | Discharge status: Home / Self Care | Payer: Self-pay | Source: Ambulatory Visit | Attending: Obstetrics & Gynecology

## 2015-09-14 ENCOUNTER — Ambulatory Visit
Admission: RE | Admit: 2015-09-14 | Discharge: 2015-09-14 | Disposition: A | Discharge status: Home / Self Care | Payer: Medicaid Other | Source: Ambulatory Visit | Attending: Obstetrics & Gynecology | Admitting: Obstetrics & Gynecology

## 2015-09-14 DIAGNOSIS — Y836 Removal of other organ (partial) (total) as the cause of abnormal reaction of the patient, or of later complication, without mention of misadventure at the time of the procedure: Secondary | ICD-10-CM | POA: Insufficient documentation

## 2015-09-14 DIAGNOSIS — R102 Pelvic and perineal pain: Secondary | ICD-10-CM | POA: Insufficient documentation

## 2015-09-14 DIAGNOSIS — R112 Nausea with vomiting, unspecified: Secondary | ICD-10-CM | POA: Diagnosis not present

## 2015-09-14 DIAGNOSIS — N9989 Other postprocedural complications and disorders of genitourinary system: Secondary | ICD-10-CM | POA: Diagnosis present

## 2015-09-14 DIAGNOSIS — Z6841 Body Mass Index (BMI) 40.0 and over, adult: Secondary | ICD-10-CM | POA: Diagnosis not present

## 2015-09-14 DIAGNOSIS — R1031 Right lower quadrant pain: Secondary | ICD-10-CM | POA: Diagnosis not present

## 2015-09-14 DIAGNOSIS — Z79899 Other long term (current) drug therapy: Secondary | ICD-10-CM | POA: Insufficient documentation

## 2015-09-14 DIAGNOSIS — Z9049 Acquired absence of other specified parts of digestive tract: Secondary | ICD-10-CM | POA: Diagnosis not present

## 2015-09-14 HISTORY — PX: TRACHELECTOMY: SHX6586

## 2015-09-14 HISTORY — PX: LAPAROSCOPY: SHX197

## 2015-09-14 LAB — BASIC METABOLIC PANEL
ANION GAP: 6 (ref 5–15)
BUN: 10 mg/dL (ref 6–20)
CALCIUM: 8.9 mg/dL (ref 8.9–10.3)
CHLORIDE: 103 mmol/L (ref 101–111)
CO2: 27 mmol/L (ref 22–32)
Creatinine, Ser: 0.79 mg/dL (ref 0.44–1.00)
GFR calc non Af Amer: >60 mL/min (ref >60)
GLUCOSE: 153 mg/dL — AB (ref 65–99)
POTASSIUM: 4.4 mmol/L (ref 3.5–5.1)
Sodium: 136 mmol/L (ref 135–145)

## 2015-09-14 LAB — CBC WITH DIFFERENTIAL/PLATELET
BASOS ABS: 0 10*3/uL (ref 0–0.1)
Basophils Relative: 0 %
Eosinophils Absolute: 0 10*3/uL (ref 0–0.7)
Eosinophils Relative: 0 %
HEMATOCRIT: 41.2 % (ref 35.0–47.0)
Hemoglobin: 14.2 g/dL (ref 12.0–16.0)
LYMPHS PCT: 4 %
Lymphs Abs: 0.7 10*3/uL — ABNORMAL LOW (ref 1.0–3.6)
MCH: 30.1 pg (ref 26.0–34.0)
MCHC: 34.5 g/dL (ref 32.0–36.0)
MCV: 87.2 fL (ref 80.0–100.0)
MONO ABS: 0.1 10*3/uL — AB (ref 0.2–0.9)
Monocytes Relative: 1 %
NEUTROS ABS: 18.2 10*3/uL — AB (ref 1.4–6.5)
NEUTROS PCT: 95 %
Platelets: 301 10*3/uL (ref 150–440)
RBC: 4.72 MIL/uL (ref 3.80–5.20)
RDW: 14 % (ref 11.5–14.5)
WBC: 19 10*3/uL — ABNORMAL HIGH (ref 3.6–11.0)

## 2015-09-14 SURGERY — LAPAROSCOPY, DIAGNOSTIC
Anesthesia: General | Laterality: Right | Wound class: Clean Contaminated

## 2015-09-14 MED ORDER — MORPHINE SULFATE (PF) 4 MG/ML IV SOLN
4.0000 mg | Freq: Once | INTRAVENOUS | Status: AC
  Administered 2015-09-14: 4 mg via INTRAVENOUS
  Filled 2015-09-14: qty 1

## 2015-09-14 MED ORDER — FAMOTIDINE 20 MG PO TABS
20.0000 mg | ORAL_TABLET | Freq: Once | ORAL | Status: AC
  Administered 2015-09-14: 20 mg via ORAL

## 2015-09-14 MED ORDER — BUPIVACAINE HCL 0.5 % IJ SOLN
INTRAMUSCULAR | Status: DC | PRN
  Administered 2015-09-14: 10 mL

## 2015-09-14 MED ORDER — PROMETHAZINE HCL 25 MG/ML IJ SOLN
INTRAMUSCULAR | Status: AC
  Administered 2015-09-14: 6.25 mg via INTRAVENOUS
  Filled 2015-09-14: qty 1

## 2015-09-14 MED ORDER — ONDANSETRON HCL 4 MG/2ML IJ SOLN
INTRAMUSCULAR | Status: AC
  Filled 2015-09-14: qty 2

## 2015-09-14 MED ORDER — KETOROLAC TROMETHAMINE 30 MG/ML IJ SOLN
INTRAMUSCULAR | Status: AC
  Administered 2015-09-14: 30 mg via INTRAVENOUS
  Filled 2015-09-14: qty 1

## 2015-09-14 MED ORDER — OXYCODONE-ACETAMINOPHEN 5-325 MG PO TABS
ORAL_TABLET | ORAL | Status: AC
  Filled 2015-09-14: qty 1

## 2015-09-14 MED ORDER — PROPOFOL 10 MG/ML IV BOLUS
INTRAVENOUS | Status: DC | PRN
  Administered 2015-09-14: 200 mg via INTRAVENOUS

## 2015-09-14 MED ORDER — ONDANSETRON HCL 4 MG/2ML IJ SOLN
4.0000 mg | Freq: Once | INTRAMUSCULAR | Status: AC | PRN
  Administered 2015-09-14: 4 mg via INTRAVENOUS

## 2015-09-14 MED ORDER — HYDRALAZINE HCL 20 MG/ML IJ SOLN
INTRAMUSCULAR | Status: DC | PRN
  Administered 2015-09-14: 5 mg via INTRAVENOUS

## 2015-09-14 MED ORDER — ONDANSETRON HCL 4 MG/2ML IJ SOLN
4.0000 mg | Freq: Once | INTRAMUSCULAR | Status: AC
  Administered 2015-09-14: 4 mg via INTRAVENOUS

## 2015-09-14 MED ORDER — DEXAMETHASONE SODIUM PHOSPHATE 4 MG/ML IJ SOLN
INTRAMUSCULAR | Status: DC | PRN
  Administered 2015-09-14: 10 mg via INTRAVENOUS

## 2015-09-14 MED ORDER — LIDOCAINE-EPINEPHRINE 1 %-1:100000 IJ SOLN
INTRAMUSCULAR | Status: DC | PRN
  Administered 2015-09-14: 7 mL

## 2015-09-14 MED ORDER — FENTANYL CITRATE (PF) 100 MCG/2ML IJ SOLN
INTRAMUSCULAR | Status: DC | PRN
  Administered 2015-09-14: 50 ug via INTRAVENOUS
  Administered 2015-09-14: 100 ug via INTRAVENOUS
  Administered 2015-09-14 (×2): 50 ug via INTRAVENOUS

## 2015-09-14 MED ORDER — LACTATED RINGERS IV SOLN
INTRAVENOUS | Status: DC
  Administered 2015-09-14 (×2): via INTRAVENOUS

## 2015-09-14 MED ORDER — LIDOCAINE HCL (CARDIAC) 20 MG/ML IV SOLN
INTRAVENOUS | Status: DC | PRN
  Administered 2015-09-14: 100 mg via INTRAVENOUS

## 2015-09-14 MED ORDER — CEFOXITIN SODIUM-DEXTROSE 2-2.2 GM-% IV SOLR (PREMIX)
INTRAVENOUS | Status: AC
  Administered 2015-09-14: 2000 mg via INTRAVENOUS
  Filled 2015-09-14: qty 50

## 2015-09-14 MED ORDER — MIDAZOLAM HCL 2 MG/2ML IJ SOLN
INTRAMUSCULAR | Status: DC | PRN
  Administered 2015-09-14: 2 mg via INTRAVENOUS

## 2015-09-14 MED ORDER — GLYCOPYRROLATE 0.2 MG/ML IJ SOLN
INTRAMUSCULAR | Status: DC | PRN
  Administered 2015-09-14: .8 mg via INTRAVENOUS

## 2015-09-14 MED ORDER — ONDANSETRON HCL 4 MG/2ML IJ SOLN
4.0000 mg | Freq: Once | INTRAMUSCULAR | Status: AC
  Administered 2015-09-14: 4 mg via INTRAVENOUS
  Filled 2015-09-14: qty 2

## 2015-09-14 MED ORDER — CEFOXITIN SODIUM-DEXTROSE 2-2.2 GM-% IV SOLR (PREMIX)
2.0000 g | Freq: Once | INTRAVENOUS | Status: AC
  Administered 2015-09-14: 2000 mg via INTRAVENOUS

## 2015-09-14 MED ORDER — NEOSTIGMINE METHYLSULFATE 10 MG/10ML IV SOLN
INTRAVENOUS | Status: DC | PRN
  Administered 2015-09-14: 5 mg via INTRAVENOUS

## 2015-09-14 MED ORDER — ACETAMINOPHEN 650 MG RE SUPP: 650.0000 mg | RECTAL | Status: DC | PRN

## 2015-09-14 MED ORDER — SUCCINYLCHOLINE CHLORIDE 20 MG/ML IJ SOLN
INTRAMUSCULAR | Status: DC | PRN
  Administered 2015-09-14: 100 mg via INTRAVENOUS

## 2015-09-14 MED ORDER — ROCURONIUM BROMIDE 100 MG/10ML IV SOLN
INTRAVENOUS | Status: DC | PRN
  Administered 2015-09-14: 10 mg via INTRAVENOUS
  Administered 2015-09-14: 30 mg via INTRAVENOUS
  Administered 2015-09-14: 20 mg via INTRAVENOUS

## 2015-09-14 MED ORDER — FENTANYL CITRATE (PF) 100 MCG/2ML IJ SOLN
INTRAMUSCULAR | Status: AC
  Administered 2015-09-14: 25 ug via INTRAVENOUS
  Filled 2015-09-14: qty 2

## 2015-09-14 MED ORDER — KETOROLAC TROMETHAMINE 30 MG/ML IJ SOLN
INTRAMUSCULAR | Status: AC
  Filled 2015-09-14: qty 1

## 2015-09-14 MED ORDER — OXYCODONE-ACETAMINOPHEN 5-325 MG PO TABS
1.0000 | ORAL_TABLET | Freq: Four times a day (QID) | ORAL | Status: DC | PRN
Start: 2015-09-14 — End: 2015-09-14
  Administered 2015-09-14: 1 via ORAL

## 2015-09-14 MED ORDER — SODIUM CHLORIDE 0.9 % IJ SOLN
INTRAMUSCULAR | Status: AC
  Filled 2015-09-14: qty 10

## 2015-09-14 MED ORDER — PROMETHAZINE HCL 25 MG/ML IJ SOLN
6.2500 mg | Freq: Four times a day (QID) | INTRAMUSCULAR | Status: AC | PRN
  Administered 2015-09-14: 6.25 mg via INTRAVENOUS

## 2015-09-14 MED ORDER — ACETAMINOPHEN 325 MG PO TABS: 650.0000 mg | ORAL_TABLET | ORAL | Status: DC | PRN

## 2015-09-14 MED ORDER — OXYCODONE-ACETAMINOPHEN 5-325 MG PO TABS: 1.0000 | ORAL_TABLET | Freq: Four times a day (QID) | ORAL | Status: DC | PRN

## 2015-09-14 MED ORDER — ONDANSETRON HCL 4 MG/2ML IJ SOLN
INTRAMUSCULAR | Status: DC | PRN
  Administered 2015-09-14: 4 mg via INTRAVENOUS

## 2015-09-14 MED ORDER — SULFANILAMIDE 15 % VA CREA
TOPICAL_CREAM | VAGINAL | Status: AC
  Filled 2015-09-14: qty 120

## 2015-09-14 MED ORDER — ONDANSETRON HCL 4 MG/2ML IJ SOLN
INTRAMUSCULAR | Status: AC
  Administered 2015-09-14: 4 mg via INTRAVENOUS
  Filled 2015-09-14: qty 2

## 2015-09-14 MED ORDER — FAMOTIDINE 20 MG PO TABS
ORAL_TABLET | ORAL | Status: AC
  Administered 2015-09-14: 20 mg via ORAL
  Filled 2015-09-14: qty 1

## 2015-09-14 MED ORDER — LIDOCAINE-EPINEPHRINE 1 %-1:100000 IJ SOLN
INTRAMUSCULAR | Status: AC
  Filled 2015-09-14: qty 1

## 2015-09-14 MED ORDER — BUPIVACAINE HCL (PF) 0.5 % IJ SOLN
INTRAMUSCULAR | Status: AC
  Filled 2015-09-14: qty 30

## 2015-09-14 MED ORDER — KETOROLAC TROMETHAMINE 30 MG/ML IJ SOLN
30.0000 mg | Freq: Four times a day (QID) | INTRAMUSCULAR | Status: DC
  Administered 2015-09-14: 30 mg via INTRAVENOUS

## 2015-09-14 MED ORDER — FENTANYL CITRATE (PF) 100 MCG/2ML IJ SOLN
25.0000 ug | INTRAMUSCULAR | Status: DC | PRN
  Administered 2015-09-14 (×2): 25 ug via INTRAVENOUS

## 2015-09-14 SURGICAL SUPPLY — 67 items
APPLICATOR ARISTA FLEXITIP XL (MISCELLANEOUS) ×4 IMPLANT
BAG COUNTER SPONGE EZ (MISCELLANEOUS) IMPLANT
BAG URO DRAIN 2000ML W/SPOUT (MISCELLANEOUS) ×4 IMPLANT
BLADE SURG SZ10 CARB STEEL (BLADE) ×4 IMPLANT
BLADE SURG SZ11 CARB STEEL (BLADE) ×4 IMPLANT
BNDG GAUZE 4.5X4.1 6PLY STRL (MISCELLANEOUS) IMPLANT
CANISTER SUCT 1200ML W/VALVE (MISCELLANEOUS) ×4 IMPLANT
CATH FOLEY 2WAY  5CC 16FR (CATHETERS) ×2
CATH ROBINSON RED A/P 16FR (CATHETERS) IMPLANT
CATH URTH 16FR FL 2W BLN LF (CATHETERS) ×2 IMPLANT
CHLORAPREP W/TINT 26ML (MISCELLANEOUS) ×4 IMPLANT
COUNTER SPONGE BAG EZ (MISCELLANEOUS)
DRAPE PERI LITHO V/GYN (MISCELLANEOUS) ×4 IMPLANT
DRAPE SHEET LG 3/4 BI-LAMINATE (DRAPES) ×4 IMPLANT
DRAPE SURG 17X11 SM STRL (DRAPES) ×4 IMPLANT
DRAPE UNDER BUTTOCK W/FLU (DRAPES) ×4 IMPLANT
DRSG TEGADERM 2-3/8X2-3/4 SM (GAUZE/BANDAGES/DRESSINGS) ×12 IMPLANT
GAUZE SPONGE NON-WVN 2X2 STRL (MISCELLANEOUS) ×4 IMPLANT
GLOVE BIO SURGEON STRL SZ7 (GLOVE) IMPLANT
GLOVE BIO SURGEON STRL SZ8 (GLOVE) IMPLANT
GLOVE INDICATOR 7.5 STRL GRN (GLOVE) ×16 IMPLANT
GLOVE INDICATOR 8.0 STRL GRN (GLOVE) ×16 IMPLANT
GOWN STRL REUS W/ TWL LRG LVL3 (GOWN DISPOSABLE) ×6 IMPLANT
GOWN STRL REUS W/ TWL XL LVL3 (GOWN DISPOSABLE) ×2 IMPLANT
GOWN STRL REUS W/TWL LRG LVL3 (GOWN DISPOSABLE) ×6
GOWN STRL REUS W/TWL XL LVL3 (GOWN DISPOSABLE) ×2
HANDLE YANKAUER SUCT BULB TIP (MISCELLANEOUS) ×4 IMPLANT
HEMOSTAT ARISTA ABSORB 1G (Miscellaneous) ×4 IMPLANT
IRRIGATION STRYKERFLOW (MISCELLANEOUS) IMPLANT
IRRIGATOR STRYKERFLOW (MISCELLANEOUS)
IV LACTATED RINGERS 1000ML (IV SOLUTION) ×4 IMPLANT
KIT RM TURNOVER CYSTO AR (KITS) ×4 IMPLANT
LABEL OR SOLS (LABEL) ×4 IMPLANT
LIQUID BAND (GAUZE/BANDAGES/DRESSINGS) ×4 IMPLANT
NDL SAFETY 18GX1.5 (NEEDLE) ×4 IMPLANT
NDL SAFETY 22GX1.5 (NEEDLE) ×4 IMPLANT
NEEDLE SPNL 22GX3.5 QUINCKE BK (NEEDLE) ×4 IMPLANT
NEEDLE VERESS 14GA 120MM (NEEDLE) ×4 IMPLANT
NS IRRIG 500ML POUR BTL (IV SOLUTION) ×4 IMPLANT
PACK BASIN MINOR ARMC (MISCELLANEOUS) ×4 IMPLANT
PACK GYN LAPAROSCOPIC (MISCELLANEOUS) ×4 IMPLANT
PAD GROUND ADULT SPLIT (MISCELLANEOUS) ×4 IMPLANT
PAD OB MATERNITY 4.3X12.25 (PERSONAL CARE ITEMS) ×4 IMPLANT
PAD PREP 24X41 OB/GYN DISP (PERSONAL CARE ITEMS) ×4 IMPLANT
PENCIL ELECTRO HAND CTR (MISCELLANEOUS) IMPLANT
SCISSORS METZENBAUM CVD 33 (INSTRUMENTS) IMPLANT
SHEARS HARMONIC ACE PLUS 36CM (ENDOMECHANICALS) ×4 IMPLANT
SLEEVE ENDOPATH XCEL 5M (ENDOMECHANICALS) ×8 IMPLANT
SLEEVE PROTECTION STRL DISP (MISCELLANEOUS) ×4 IMPLANT
SPONGE VERSALON 2X2 STRL (MISCELLANEOUS) ×4
SPONGE XRAY 4X4 16PLY STRL (MISCELLANEOUS) IMPLANT
SUT ETHIBOND NAB CT1 #1 30IN (SUTURE) ×4 IMPLANT
SUT VIC AB 0 CT1 27 (SUTURE) ×4
SUT VIC AB 0 CT1 27XCR 8 STRN (SUTURE) ×4 IMPLANT
SUT VIC AB 0 CT1 36 (SUTURE) ×4 IMPLANT
SUT VIC AB 1 CT1 36 (SUTURE) IMPLANT
SUT VIC AB 2-0 CT1 (SUTURE) ×8 IMPLANT
SUT VIC AB 2-0 CT1 27 (SUTURE) ×2
SUT VIC AB 2-0 CT1 TAPERPNT 27 (SUTURE) ×2 IMPLANT
SUT VIC AB 2-0 UR6 27 (SUTURE) IMPLANT
SUT VIC AB 4-0 PS2 18 (SUTURE) IMPLANT
SYR 30ML LL (SYRINGE) ×4 IMPLANT
SYR CONTROL 10ML (SYRINGE) IMPLANT
SYRINGE 10CC LL (SYRINGE) ×8 IMPLANT
TROCAR ENDO BLADELESS 11MM (ENDOMECHANICALS) ×4 IMPLANT
TROCAR XCEL NON-BLD 5MMX100MML (ENDOMECHANICALS) ×4 IMPLANT
TUBING INSUFFLATOR HI FLOW (MISCELLANEOUS) ×4 IMPLANT

## 2015-09-14 NOTE — Anesthesia Preprocedure Evaluation (Signed)
Anesthesia Evaluation  Patient identified by MRN, date of birth, ID band Patient awake    Reviewed: Allergy & Precautions, NPO status , Patient's Chart, lab work & pertinent test results  History of Anesthesia Complications Negative for: history of anesthetic complications  Airway Mallampati: III  TM Distance: >3 FB Neck ROM: Full    Dental  (+) Teeth Intact   Pulmonary neg pulmonary ROS,           Cardiovascular negative cardio ROS       Neuro/Psych negative neurological ROS     GI/Hepatic Neg liver ROS, GERD (remote hx)  ,  Endo/Other  negative endocrine ROSMorbid obesity  Renal/GU negative Renal ROS     Musculoskeletal   Abdominal   Peds  Hematology   Anesthesia Other Findings   Reproductive/Obstetrics                             Anesthesia Physical Anesthesia Plan  ASA: II  Anesthesia Plan: General   Post-op Pain Management:    Induction: Intravenous  Airway Management Planned: Oral ETT  Additional Equipment:   Intra-op Plan:   Post-operative Plan:   Informed Consent: I have reviewed the patients History and Physical, chart, labs and discussed the procedure including the risks, benefits and alternatives for the proposed anesthesia with the patient or authorized representative who has indicated his/her understanding and acceptance.     Plan Discussed with:   Anesthesia Plan Comments:         Anesthesia Quick Evaluation

## 2015-09-14 NOTE — H&P (Signed)
History and Physical Interval Note:  09/14/2015 12:46 PM  Mercedes [name]  has presented today for surgery, with the diagnosis of PELVIC PAIN  The various methods of treatment have been discussed with the patient and family. After consideration of risks, benefits and other options for treatment, the patient has consented to  Procedure(s): LAPAROSCOPY DIAGNOSTIC (N/A) LAPAROSCOPIC OOPHORECTOMY (Right) TRACHELECTOMY (N/A) as a surgical intervention .  The patient's history has been reviewed, patient examined, no change in status, stable for surgery.  Pt has the following beta blocker history-  Not taking Beta Blocker.  I have reviewed the patient's chart and labs.  Questions were answered to the patient's satisfaction.       HARRIS,ROBERT Renae Fickle

## 2015-09-14 NOTE — Transfer of Care (Signed)
Immediate Anesthesia Transfer of Care Note  Patient: Mercedes [name]  Procedure(s) Performed: Procedure(s): LAPAROSCOPY DIAGNOSTIC (N/A) LAPAROSCOPIC OOPHORECTOMY (Right) TRACHELECTOMY (N/A)  Patient Location: PACU  Anesthesia Type:General  Level of Consciousness: awake, alert , oriented and patient cooperative  Airway & Oxygen Therapy: Patient Spontanous Breathing and Patient connected to face mask oxygen  Post-op Assessment: Report given to RN, Post -op Vital signs reviewed and stable and Patient moving all extremities X 4  Post vital signs: Reviewed and stable  Last Vitals:  Filed Vitals:   09/14/15 1514  BP: 152/98  Pulse: 92  Temp: 37.1 C  Resp: 13    Complications: No apparent anesthesia complications

## 2015-09-14 NOTE — ED Notes (Addendum)
Pt reports she had her right ovary and cervix removed today.  Pt was discharged from [facility] at 1830 this evening and when pt got home she vomited x 3.  Surgery done by dr Tiburcio Pea. Pt reports low abd pain with nausea now.  Iv started and labs sent in triage.

## 2015-09-14 NOTE — Op Note (Signed)
  Operative Note   09/14/2015  PRE-OP DIAGNOSIS: Right Lower Quadrant Pain   POST-OP DIAGNOSIS: same   PROCEDURE: Procedure(s): LAPAROSCOPY DIAGNOSTIC LAPAROSCOPIC RIGHT OOPHORECTOMY TRACHELECTOMY   SURGEON: Annamarie Major, MD, FACOG  ASST: [facility]  ANESTHESIA: Choice   ESTIMATED BLOOD LOSS: 200 mL  COMPLICATIONS: none  DISPOSITION: PACU - hemodynamically stable.  CONDITION: stable  FINDINGS: Laparoscopic survey of the abdomen revealed a grossly normal pelvic cavity. No intra-abdominal adhesions were noted. Ovaries normal in appearance, as was appendix.  Cervix healed normally after [facility].  No adhesions on cervix.  PROCEDURE IN DETAIL: The patient was taken to the OR where anesthesia was administed. The patient was positioned in dorsal lithotomy in the [place] stirrups. The patient was then examined under anesthesia with the above noted findings. The patient was prepped and draped in the normal sterile fashion and foley catheter was placed. A Graves speculum was placed in the vagina with normal visualized cervix. Sponge stick placed.  The speculum was then removed.  Attention was turned to the patient's abdomen where a 5 mm skin incision was made in the umbilical fold, after injection of local anesthesia. The Veress step needle was carefully introduced into the peritoneal cavity with placement confirmed using the hanging drop technique.  Pneumoperitoneum was obtained. The 5 mm port was then placed under direct visualization with the operative laparoscope.  Trendelenburg positioning.  Additional 11mm trocar was then placed in the RLQ lateral to the inferior epigastric blood vessels under direct visualization with the laparoscope, and a 5 mm trocar and prt was placed in the suprapubic region.  Instrumentation to visualize complete pelvic anatomy performed. The right ovary was grasped and using the 5 mm Harmonic Scapel the infundibulopelvic ligament and blood vessels were carefully coagulated  and the ovay was excised.  Ureter and bowel were seen to be out of harms way.  Ovary retrieved thru Endopouch.  No adhesions to internal cervix noted.  Instruments and trocars removed, gas expelled, and skin closed with skin adhesive glue.  Attention is then turned to the vagina, where speculum is placed along with retractors.  Cervix is grasped with a Jacobs tenaculum.  The circumference is then infiltrated with 1%lidocaine with epinephrine and then dissected first with bovie electrocautery, and then further with mayo scissors.  Uterosacral ligaments are clamped, transected and suture ligated. Further pedicles/tissue laterally are clamped, transected, and suture ligated.  Anterior and posterior cul de sacs are penetrated with dissection, with care to avoid bladder and rectum.  Remaining ligamentous tissue laterally is clamped, transected and suture ligated until cervix is free and removed. Peritoneum is closed with Vicryl suture.   Vaginal tissue closed with running 2-0 Vicryl suture.  Vaginal cavity irrigated w saline.  Catheter removed. Pt goes to recovery room in stable condition.  All sponge, needle, and instrument counts correct x2 at the conclusion of the case.

## 2015-09-14 NOTE — Anesthesia Procedure Notes (Signed)
Procedure Name: Intubation Date/Time: 09/14/2015 1:01 PM Performed by: Michaele Offer Pre-anesthesia Checklist: Patient identified, Emergency Drugs available, Suction available, Patient being monitored and Timeout performed Patient Re-evaluated:Patient Re-evaluated prior to inductionOxygen Delivery Method: Circle system utilized Preoxygenation: Pre-oxygenation with 100% oxygen Intubation Type: IV induction Ventilation: Mask ventilation without difficulty Laryngoscope Size: Mac and 3 Grade View: Grade I Tube type: Oral Tube size: 7.0 mm Number of attempts: 1 Airway Equipment and Method: Rigid stylet Placement Confirmation: ETT inserted through vocal cords under direct vision,  positive ETCO2 and breath sounds checked- equal and bilateral Secured at: 20 cm Tube secured with: Tape Dental Injury: Teeth and Oropharynx as per pre-operative assessment

## 2015-09-14 NOTE — OR Nursing (Signed)
Patient had episode of nausea and vomiting, contacted Dr. Teresa Pelton and got an order for Phenergan

## 2015-09-14 NOTE — Discharge Instructions (Signed)
General Gynecological Post-Operative Instructions °You may expect to feel dizzy, weak, and drowsy for as long as 24 hours after receiving the medicine that made you sleep (anesthetic).  °Do not drive a car, ride a bicycle, participate in physical activities, or take public transportation until you are done taking narcotic pain medicines or as directed by your doctor.  °Do not drink alcohol or take tranquilizers.  °Do not take medicine that has not been prescribed by your doctor.  °Do not sign important papers or make important decisions while on narcotic pain medicines.  °Have a responsible person with you.  °CARE OF INCISION  °Keep incision clean and dry. °Take showers instead of baths until your doctor gives you permission to take baths.  °Avoid heavy lifting (more than 10 pounds/4.5 kilograms), pushing, or pulling.  °Avoid activities that may risk injury to your surgical site.  °No sexual intercourse or placement of anything in the vagina for 6 weeks or as instructed by your doctor. °If you have tubes coming from the wound site, check with your doctor regarding appropriate care of the tubes. °Only take prescription or over-the-counter medicines  for pain, discomfort, or fever as directed by your doctor. Do not take aspirin. It can make you bleed. Take medicines (antibiotics) that kill germs if they are prescribed for you.  °Call the office or go to the ER if:  °You feel sick to your stomach (nauseous) and you start to throw up (vomit).  °You have trouble eating or drinking.  °You have an oral temperature above 101.  °You have constipation that is not helped by adjusting diet or increasing fluid intake. Pain medicines are a common cause of constipation.  °You have any other concerns. °SEEK IMMEDIATE MEDICAL CARE IF:  °You have persistent dizziness.  °You have difficulty breathing or a congested sounding (croupy) cough.  °You have an oral temperature above 102.5, not controlled by medicine.  °There is increasing  pain or tenderness near or in the surgical site.  ° °AMBULATORY SURGERY  °DISCHARGE INSTRUCTIONS ° ° °1) The drugs that you were given will stay in your system until tomorrow so for the next 24 hours you should not: ° °A) Drive an automobile °B) Make any legal decisions °C) Drink any alcoholic beverage ° ° °2) You may resume regular meals tomorrow.  Today it is better to start with liquids and gradually work up to solid foods. ° °You may eat anything you prefer, but it is better to start with liquids, then soup and crackers, and gradually work up to solid foods. ° ° °3) Please notify your doctor immediately if you have any unusual bleeding, trouble breathing, redness and pain at the surgery site, drainage, fever, or pain not relieved by medication. ° ° ° °4) Additional Instructions: ° ° ° ° ° ° ° °Please contact your physician with any problems or Same Day Surgery at [phone], Monday through Friday 6 am to 4 pm, or [place] [facility] [place] Main number at [phone]. ° ° ° °

## 2015-09-14 NOTE — Anesthesia Postprocedure Evaluation (Signed)
  Anesthesia Post-op Note  Patient: Mercedes [name]  Procedure(s) Performed: Procedure(s): LAPAROSCOPY DIAGNOSTIC (N/A) LAPAROSCOPIC OOPHORECTOMY (Right) TRACHELECTOMY (N/A)  Anesthesia type:General  Patient location: PACU  Post pain: Pain level controlled  Post assessment: Post-op Vital signs reviewed, Patient's Cardiovascular Status Stable, Respiratory Function Stable, Patent Airway and No signs of Nausea or vomiting  Post vital signs: Reviewed and stable  Last Vitals:  Filed Vitals:   09/14/15 1529  BP: 149/98  Pulse: 86  Temp:   Resp: 17    Level of consciousness: awake, alert  and patient cooperative  Complications: No apparent anesthesia complications

## 2015-09-15 ENCOUNTER — Encounter: Payer: Self-pay | Admitting: Obstetrics & Gynecology

## 2015-09-15 ENCOUNTER — Emergency Department
Admission: EM | Admit: 2015-09-15 | Discharge: 2015-09-15 | Disposition: A | Discharge status: Home / Self Care | Payer: Medicaid Other | Attending: Emergency Medicine | Admitting: Emergency Medicine

## 2015-09-15 DIAGNOSIS — R112 Nausea with vomiting, unspecified: Secondary | ICD-10-CM

## 2015-09-15 MED ORDER — HYDROMORPHONE HCL 1 MG/ML IJ SOLN
1.0000 mg | INTRAMUSCULAR | Status: AC
  Administered 2015-09-15: 1 mg via INTRAVENOUS
  Filled 2015-09-15: qty 1

## 2015-09-15 MED ORDER — PROMETHAZINE HCL 25 MG/ML IJ SOLN
INTRAMUSCULAR | Status: AC
  Administered 2015-09-15: 25 mg via INTRAMUSCULAR
  Filled 2015-09-15: qty 1

## 2015-09-15 MED ORDER — ONDANSETRON 8 MG PO TBDP: 8.0000 mg | ORAL_TABLET | Freq: Three times a day (TID) | ORAL | Status: DC | PRN

## 2015-09-15 MED ORDER — DEXTROSE 5 % AND 0.9 % NACL IV BOLUS
1000.0000 mL | Freq: Once | INTRAVENOUS | Status: AC
  Administered 2015-09-15: 1000 mL via INTRAVENOUS

## 2015-09-15 MED ORDER — PROMETHAZINE HCL 25 MG/ML IJ SOLN
25.0000 mg | Freq: Once | INTRAMUSCULAR | Status: AC
  Administered 2015-09-15: 25 mg via INTRAMUSCULAR

## 2015-09-15 MED ORDER — PROMETHAZINE HCL 25 MG/ML IJ SOLN: 25.0000 mg | Freq: Once | INTRAMUSCULAR | Status: DC

## 2015-09-15 NOTE — ED Provider Notes (Signed)
[facility] Emergency Department Provider Note  ____________________________________________  Time seen: 1:00 AM  I have reviewed the triage vital signs and the nursing notes.   HISTORY  Chief Complaint Post-op Problem    HPI Mercedes [name] is a 34 y.o. female who had laparoscopic cystectomy and cervix removal today by Dr. Tiburcio Pea. Everything was going well, and she returned home, but after trying to take her Percocet she developed nausea and vomiting. She is still very nauseous and not tolerating oral intake. She notes that she has not had anything to eat in more than 24 hours due to the need to have an empty stomach for surgery. No fever or chills. With the vomiting and extreme nausea as she has felt occasional lightheadedness. This is worse when she stands up.    Past Medical History  Diagnosis Date  . Acid reflux     history of reflux, pt no longer has symptoms     There are no active problems to display for this patient.    Past Surgical History  Procedure Laterality Date  . Abdominal hysterectomy    . Cesarean section  2007 +2008    x 2  . Tubal ligation    . Cholecystectomy    . Tonsillectomy    . Hernia repair       Current Outpatient Rx  Name  Route  Sig  Dispense  Refill  . dicyclomine (BENTYL) 20 MG tablet   Oral   Take 1 tablet (20 mg total) by mouth 3 (three) times daily as needed for spasms. Patient not taking: Reported on 09/14/2015   20 tablet   0   . ibuprofen (ADVIL,MOTRIN) 600 MG tablet   Oral   Take 1 tablet (600 mg total) by mouth every 6 (six) hours as needed for moderate pain.   15 tablet   0   . ondansetron (ZOFRAN ODT) 8 MG disintegrating tablet   Oral   Take 1 tablet (8 mg total) by mouth every 8 (eight) hours as needed for nausea or vomiting.   20 tablet   0   . ondansetron (ZOFRAN) 4 MG tablet   Oral   Take 1 tablet (4 mg total) by mouth daily as needed for nausea or vomiting. Patient not taking:  Reported on 09/14/2015   20 tablet   1   . oxyCODONE-acetaminophen (PERCOCET/ROXICET) 5-325 MG per tablet   Oral   Take 1-2 tablets by mouth every 6 (six) hours as needed for moderate pain.   30 tablet   0      Allergies Gabapentin; Macrobid; and Tape   No family history on file.  Social History Social History  Substance Use Topics  . Smoking status: Never Smoker   . Smokeless tobacco: Never Used  . Alcohol Use: No    Review of Systems  Constitutional:   No fever or chills. No weight changes Eyes:   No blurry vision or double vision.  ENT:   No sore throat. Cardiovascular:   No chest pain. Respiratory:   No dyspnea or cough. Gastrointestinal:   Nausea and vomiting. Generalized abdominal discomfort since surgery but not worse and nonfocal..  No BRBPR or melena. Genitourinary:   Negative for dysuria, urinary retention, bloody urine, or difficulty urinating. Musculoskeletal:   Negative for back pain. No joint swelling or pain. Skin:   Negative for rash. Neurological:   Negative for headaches, focal weakness or numbness. Psychiatric:  No anxiety or depression.   Endocrine:  No hot/cold intolerance, changes in energy, or sleep difficulty.  10-point ROS otherwise negative.  ____________________________________________   PHYSICAL EXAM:  VITAL SIGNS: ED Triage Vitals  Enc Vitals Group     BP 09/14/15 2226 156/91 mmHg     Pulse Rate 09/14/15 2226 111     Resp 09/14/15 2226 20     Temp 09/14/15 2226 98.4 F (36.9 C)     Temp Source 09/14/15 2226 Oral     SpO2 09/14/15 2226 95 %     Weight 09/14/15 2226 301 lb (136.533 kg)     Height 09/14/15 2226  (1.626 m)     Head Cir --      Peak Flow --      Pain Score 09/14/15 2227 8     Pain Loc --      Pain Edu? --      Excl. in GC? --      Constitutional:   Alert and oriented. Well appearing and in no distress. Eyes:   No scleral icterus. No conjunctival pallor. PERRL. EOMI ENT   Head:   Normocephalic  and atraumatic.   Nose:   No congestion/rhinnorhea. No septal hematoma   Mouth/Throat:   MMM, no pharyngeal erythema. No peritonsillar mass. No uvula shift.   Neck:   No stridor. No SubQ emphysema. No meningismus. Hematological/Lymphatic/Immunilogical:   No cervical lymphadenopathy. Cardiovascular:   RRR. Normal and symmetric distal pulses are present in all extremities. No murmurs, rubs, or gallops. Respiratory:   Normal respiratory effort without tachypnea nor retractions. Breath sounds are clear and equal bilaterally. No wheezes/rales/rhonchi. Gastrointestinal:   Soft , mildly distended, diffuse mild tenderness. Laparoscopic incisions are clean and intact without drainage or evidence of inflammation. There is no CVA tenderness.  No rebound, rigidity, or guarding. Genitourinary:   deferred Musculoskeletal:   Nontender with normal range of motion in all extremities. No joint effusions.  No lower extremity tenderness.  No edema. Neurologic:   Normal speech and language.  CN 2-10 normal. Motor grossly intact. No pronator drift.  Normal gait. No gross focal neurologic deficits are appreciated.  Skin:    Skin is warm, dry and intact. No rash noted.  No petechiae, purpura, or bullae. Psychiatric:   Mood and affect are normal. Speech and behavior are normal. Patient exhibits appropriate insight and judgment.  ____________________________________________    LABS (pertinent positives/negatives) (all labs ordered are listed, but only abnormal results are displayed) Labs Reviewed  CBC WITH DIFFERENTIAL/PLATELET - Abnormal; Notable for the following:    WBC 19.0 (*)    Neutro Abs 18.2 (*)    Lymphs Abs 0.7 (*)    Monocytes Absolute 0.1 (*)    All other components within normal limits  BASIC METABOLIC PANEL - Abnormal; Notable for the following:    Glucose, Bld 153 (*)    All other components within normal limits    ____________________________________________   EKG    ____________________________________________    RADIOLOGY    ____________________________________________   PROCEDURES   ____________________________________________   INITIAL IMPRESSION / ASSESSMENT AND PLAN / ED COURSE  Pertinent labs & imaging results that were available during my care of the patient were reviewed by me and considered in my medical decision making (see chart for details).  Patient presents with nausea and vomiting which is due to trying to take a Percocet on an empty stomach and being mildly dehydrated after surgery. She has a leukocytosis which I think is due to the pain as well  as the postop reaction. Low suspicion for peritonitis or any organ injury or perforation. No evidence of DVT PE urinary tract infection and pneumonia. The abdominal discomfort I think is due to the incisions and the insufflation for surgery and the manipulation of her organs due to the surgical procedure. She is overall well-appearing no acute distress, normal vital signs afebrile. She is given Zofran and Phenergan and Dilaudid in the emergency department as well as fluids, and is tolerating oral intake with crackers and water. We'll discharge her home and have her follow-up with her surgeon within the next week, tomorrow if necessary.     ____________________________________________   FINAL CLINICAL IMPRESSION(S) / ED DIAGNOSES  Final diagnoses:  Non-intractable vomiting with nausea, vomiting of unspecified type      Sharman Cheek, MD 09/15/15 [phone]

## 2015-09-15 NOTE — ED Notes (Signed)
Pts husband to the nurses station wanting to know why they were still waiting. It was explained to the pt that they were not forgotten and that we would get them back as soon as possible. Husband stated understanding and went back to subwait.

## 2015-09-15 NOTE — ED Notes (Signed)
Pt given graham crackers per MD verbal order that pt could have food.

## 2015-09-15 NOTE — Discharge Instructions (Signed)

## 2015-09-18 LAB — SURGICAL PATHOLOGY

## 2015-09-20 ENCOUNTER — Other Ambulatory Visit: Payer: Self-pay | Admitting: Obstetrics & Gynecology

## 2015-09-20 ENCOUNTER — Ambulatory Visit
Admission: RE | Admit: 2015-09-20 | Discharge: 2015-09-20 | Disposition: A | Discharge status: Home / Self Care | Payer: Medicaid Other | Source: Ambulatory Visit | Attending: Obstetrics & Gynecology | Admitting: Obstetrics & Gynecology

## 2015-09-20 DIAGNOSIS — R1031 Right lower quadrant pain: Secondary | ICD-10-CM

## 2015-09-20 DIAGNOSIS — Z90710 Acquired absence of both cervix and uterus: Secondary | ICD-10-CM | POA: Diagnosis not present

## 2015-09-20 DIAGNOSIS — R112 Nausea with vomiting, unspecified: Secondary | ICD-10-CM | POA: Insufficient documentation

## 2015-09-20 MED ORDER — IOHEXOL 300 MG/ML  SOLN
100.0000 mL | Freq: Once | INTRAMUSCULAR | Status: AC | PRN
  Administered 2015-09-20: 100 mL via INTRAVENOUS

## 2015-09-20 MED ORDER — BARIUM SULFATE 2 % PO SUSP
450.0000 mL | Freq: Once | ORAL | Status: DC
  Filled 2015-09-20: qty 450

## 2015-10-24 ENCOUNTER — Emergency Department
Admission: EM | Admit: 2015-10-24 | Discharge: 2015-10-24 | Disposition: A | Discharge status: Home / Self Care | Payer: Medicaid Other | Attending: Emergency Medicine | Admitting: Emergency Medicine

## 2015-10-24 ENCOUNTER — Encounter: Payer: Self-pay | Admitting: Emergency Medicine

## 2015-10-24 ENCOUNTER — Emergency Department: Payer: Medicaid Other

## 2015-10-24 DIAGNOSIS — Z3202 Encounter for pregnancy test, result negative: Secondary | ICD-10-CM | POA: Diagnosis not present

## 2015-10-24 DIAGNOSIS — R1031 Right lower quadrant pain: Secondary | ICD-10-CM | POA: Diagnosis not present

## 2015-10-24 LAB — COMPREHENSIVE METABOLIC PANEL
ALK PHOS: 62 U/L (ref 38–126)
ALT: 15 U/L (ref 14–54)
ANION GAP: 6 (ref 5–15)
AST: 11 U/L — ABNORMAL LOW (ref 15–41)
Albumin: 3.9 g/dL (ref 3.5–5.0)
BILIRUBIN TOTAL: 0.4 mg/dL (ref 0.3–1.2)
BUN: 9 mg/dL (ref 6–20)
CALCIUM: 8.6 mg/dL — AB (ref 8.9–10.3)
CO2: 28 mmol/L (ref 22–32)
CREATININE: 0.72 mg/dL (ref 0.44–1.00)
Chloride: 104 mmol/L (ref 101–111)
GFR calc non Af Amer: >60 mL/min (ref >60)
Glucose, Bld: 115 mg/dL — ABNORMAL HIGH (ref 65–99)
Potassium: 4.1 mmol/L (ref 3.5–5.1)
Sodium: 138 mmol/L (ref 135–145)
TOTAL PROTEIN: 6.9 g/dL (ref 6.5–8.1)

## 2015-10-24 LAB — URINALYSIS COMPLETE WITH MICROSCOPIC (ARMC ONLY)
BILIRUBIN URINE: NEGATIVE
Bacteria, UA: NONE SEEN
Glucose, UA: NEGATIVE mg/dL
Hgb urine dipstick: NEGATIVE
KETONES UR: NEGATIVE mg/dL
NITRITE: NEGATIVE
PROTEIN: NEGATIVE mg/dL
SPECIFIC GRAVITY, URINE: 1.023 (ref 1.005–1.030)
pH: 5 (ref 5.0–8.0)

## 2015-10-24 LAB — CBC
HCT: 40.5 % (ref 35.0–47.0)
HEMOGLOBIN: 13.7 g/dL (ref 12.0–16.0)
MCH: 30.4 pg (ref 26.0–34.0)
MCHC: 33.9 g/dL (ref 32.0–36.0)
MCV: 89.6 fL (ref 80.0–100.0)
PLATELETS: 273 10*3/uL (ref 150–440)
RBC: 4.53 MIL/uL (ref 3.80–5.20)
RDW: 14 % (ref 11.5–14.5)
WBC: 12.5 10*3/uL — ABNORMAL HIGH (ref 3.6–11.0)

## 2015-10-24 LAB — POCT PREGNANCY, URINE: Preg Test, Ur: NEGATIVE

## 2015-10-24 LAB — LIPASE, BLOOD: Lipase: 27 U/L (ref 11–51)

## 2015-10-24 MED ORDER — IOHEXOL 240 MG/ML SOLN
25.0000 mL | Freq: Once | INTRAMUSCULAR | Status: AC | PRN
  Administered 2015-10-24: 25 mL via ORAL

## 2015-10-24 MED ORDER — HYDROMORPHONE HCL 1 MG/ML IJ SOLN
1.0000 mg | Freq: Once | INTRAMUSCULAR | Status: AC
  Administered 2015-10-24: 1 mg via INTRAVENOUS
  Filled 2015-10-24: qty 1

## 2015-10-24 MED ORDER — MORPHINE SULFATE (PF) 4 MG/ML IV SOLN
4.0000 mg | Freq: Once | INTRAVENOUS | Status: AC
  Administered 2015-10-24: 4 mg via INTRAVENOUS
  Filled 2015-10-24: qty 1

## 2015-10-24 MED ORDER — SODIUM CHLORIDE 0.9 % IV BOLUS (SEPSIS)
1000.0000 mL | Freq: Once | INTRAVENOUS | Status: AC
Start: 2015-10-24 — End: 2015-10-24
  Administered 2015-10-24: 1000 mL via INTRAVENOUS

## 2015-10-24 MED ORDER — ONDANSETRON HCL 4 MG/2ML IJ SOLN
4.0000 mg | Freq: Once | INTRAMUSCULAR | Status: AC
  Administered 2015-10-24: 4 mg via INTRAVENOUS
  Filled 2015-10-24: qty 2

## 2015-10-24 MED ORDER — OXYCODONE-ACETAMINOPHEN 5-325 MG PO TABS: 1.0000 | ORAL_TABLET | Freq: Four times a day (QID) | ORAL | Status: DC | PRN

## 2015-10-24 MED ORDER — IOHEXOL 350 MG/ML SOLN
100.0000 mL | Freq: Once | INTRAVENOUS | Status: AC | PRN
  Administered 2015-10-24: 100 mL via INTRAVENOUS

## 2015-10-24 NOTE — ED Notes (Signed)
RLQ pain that woke her up out of a sleep this morning

## 2015-10-24 NOTE — ED Notes (Signed)
Report given to Lana, RN.

## 2015-10-24 NOTE — ED Provider Notes (Signed)
[facility] Emergency Department Provider Note  Time seen: 7:18 AM  I have reviewed the triage vital signs and the nursing notes.   HISTORY  Chief Complaint Abdominal Pain    HPI Mercedes [name] is a 34 y.o. female with a past medical history of gastric reflux, status post right oopherectomy, cervical surgery on 09/18/15, presents the emergency department with right lower quadrant tenderness. Patient states since having the surgery she has had minimal discomfort, and no discomfort for the past several weeks. She states the pain awoke her from her sleep approximately 3 hours ago. She describes the pain as moderate, sharp/stabbing pain located in the very low right lower quadrant. Denies any vaginal bleeding or discharge. Denies any modifying factors. Denies any nausea, vomiting, fever, dysuria.     Past Medical History  Diagnosis Date  . Acid reflux     history of reflux, pt no longer has symptoms    There are no active problems to display for this patient.   Past Surgical History  Procedure Laterality Date  . Abdominal hysterectomy    . Cesarean section  2007 +2008    x 2  . Tubal ligation    . Cholecystectomy    . Tonsillectomy    . Hernia repair    . Laparoscopy N/A 09/14/2015    Procedure: LAPAROSCOPY DIAGNOSTIC;  Surgeon: Nadara Mustardobert P Harris, MD;  Location: [facility] ORS;  Service: Gynecology;  Laterality: N/A;  . Trachelectomy N/A 09/14/2015    Procedure: TRACHELECTOMY;  Surgeon: Nadara Mustardobert P Harris, MD;  Location: [facility] ORS;  Service: Gynecology;  Laterality: N/A;    Current Outpatient Rx  Name  Route  Sig  Dispense  Refill  . dicyclomine (BENTYL) 20 MG tablet   Oral   Take 1 tablet (20 mg total) by mouth 3 (three) times daily as needed for spasms. Patient not taking: Reported on 09/14/2015   20 tablet   0   . ibuprofen (ADVIL,MOTRIN) 600 MG tablet   Oral   Take 1 tablet (600 mg total) by mouth every 6 (six) hours as needed for moderate pain.   15  tablet   0   . ondansetron (ZOFRAN ODT) 8 MG disintegrating tablet   Oral   Take 1 tablet (8 mg total) by mouth every 8 (eight) hours as needed for nausea or vomiting.   20 tablet   0   . ondansetron (ZOFRAN) 4 MG tablet   Oral   Take 1 tablet (4 mg total) by mouth daily as needed for nausea or vomiting. Patient not taking: Reported on 09/14/2015   20 tablet   1   . oxyCODONE-acetaminophen (PERCOCET/ROXICET) 5-325 MG per tablet   Oral   Take 1-2 tablets by mouth every 6 (six) hours as needed for moderate pain.   30 tablet   0     Allergies Gabapentin; Macrobid; and Tape  History reviewed. No pertinent family history.  Social History Social History  Substance Use Topics  . Smoking status: Never Smoker   . Smokeless tobacco: Never Used  . Alcohol Use: No    Review of Systems Constitutional: Negative for fever. Cardiovascular: Negative for chest pain. Respiratory: Negative for shortness of breath. Gastrointestinal: Positive for right lower quadrant pain. Genitourinary: Negative for dysuria. Musculoskeletal: Negative for back pain. Neurological: Negative for headache 10-point ROS otherwise negative.  ____________________________________________   PHYSICAL EXAM:  VITAL SIGNS: ED Triage Vitals  Enc Vitals Group     BP 10/24/15 0605 154/102 mmHg  Pulse Rate 10/24/15 0605 73     Resp 10/24/15 0605 22     Temp 10/24/15 0605 97.8 F (36.6 C)     Temp Source 10/24/15 0605 Oral     SpO2 10/24/15 0605 98 %     Weight 10/24/15 0605 290 lb (131.543 kg)     Height 10/24/15 0605  (1.651 m)     Head Cir --      Peak Flow --      Pain Score 10/24/15 0605 7     Pain Loc --      Pain Edu? --      Excl. in GC? --    Constitutional: Alert and oriented. Well appearing and in no distress. Eyes: Normal exam ENT   Head: Normocephalic and atraumatic   Mouth/Throat: Mucous membranes are moist. Cardiovascular: Normal rate, regular rhythm. No  murmur Respiratory: Normal respiratory effort without tachypnea nor retractions. Breath sounds are clear and equal bilaterally. No wheezes/rales/rhonchi. Gastrointestinal: Moderate right lower quadrant tenderness palpation. No rebound or guarding. No distention. Musculoskeletal: Nontender with normal range of motion in all extremities.  Neurologic:  Normal speech and language. No gross focal neurologic deficits  Psychiatric: Mood and affect are normal. Speech and behavior are normal.  ____________________________________________    RADIOLOGY  Left ovarian cyst, otherwise no acute findings  ____________________________________________   INITIAL IMPRESSION / ASSESSMENT AND PLAN / ED COURSE  Pertinent labs & imaging results that were available during my care of the patient were reviewed by me and considered in my medical decision making (see chart for details).  Patient with right lower quadrant abdominal pain, 6 weeks status post right oopherectomy by Dr. Tiburcio Pea. Says the emergency department with acute onset of right lower quadrant abdominal pain. Patient does have moderate tenderness to palpation in the lower right lower quadrant. No modifying factors or associated symptoms. Labs are largely at baseline. Given the degree of tenderness, with recent surgery status post right oopherectomy we will proceed with a CT abdomen/pelvis to help further evaluate.  CT shows a left ovarian cyst, otherwise no acute findings. Patient's discomfort is all right-sided. I discussed with the patient the need to follow up with Dr. Tiburcio Pea, she agrees and will call him today. States her pain is improved. We'll discharge home on a short course of pain medication until the patient can follow up with OB/GYN.  ____________________________________________   FINAL CLINICAL IMPRESSION(S) / ED DIAGNOSES  RLQ abdominal pain   Minna Antis, MD 10/24/15 [phone]

## 2015-10-24 NOTE — Discharge Instructions (Signed)
Please call Dr. Tiburcio PeaHarris today for a follow-up appointment as soon as possible. He may take your pain medication as needed, as prescribed. Return to the emergency department for any worsening pain, fever, or any other symptom personally concerning to yourself.   Abdominal Pain, Adult Many things can cause abdominal pain. Usually, abdominal pain is not caused by a disease and will improve without treatment. It can often be observed and treated at home. Your health care provider will do a physical exam and possibly order blood tests and X-rays to help determine the seriousness of your pain. However, in many cases, more time must pass before a clear cause of the pain can be found. Before that point, your health care provider may not know if you need more testing or further treatment. HOME CARE INSTRUCTIONS Monitor your abdominal pain for any changes. The following actions may help to alleviate any discomfort you are experiencing:  Only take over-the-counter or prescription medicines as directed by your health care provider.  Do not take laxatives unless directed to do so by your health care provider.  Try a clear liquid diet (broth, tea, or water) as directed by your health care provider. Slowly move to a bland diet as tolerated. SEEK MEDICAL CARE IF:  You have unexplained abdominal pain.  You have abdominal pain associated with nausea or diarrhea.  You have pain when you urinate or have a bowel movement.  You experience abdominal pain that wakes you in the night.  You have abdominal pain that is worsened or improved by eating food.  You have abdominal pain that is worsened with eating fatty foods.  You have a fever. SEEK IMMEDIATE MEDICAL CARE IF:  Your pain does not go away within 2 hours.  You keep throwing up (vomiting).  Your pain is felt only in portions of the abdomen, such as the right side or the left lower portion of the abdomen.  You pass bloody or black tarry stools. MAKE  SURE YOU:  Understand these instructions.  Will watch your condition.  Will get help right away if you are not doing well or get worse.   This information is not intended to replace advice given to you by your health care provider. Make sure you discuss any questions you have with your health care provider.   Document Released: 09/18/2005 Document Revised: 08/30/2015 Document Reviewed: 08/18/2013 Elsevier Interactive Patient Education [employer]2016 Elsevier Inc.

## 2015-10-25 ENCOUNTER — Ambulatory Visit: Payer: Medicaid Other | Attending: Orthopedic Surgery | Admitting: Physical Therapy

## 2015-10-25 DIAGNOSIS — M25512 Pain in left shoulder: Secondary | ICD-10-CM | POA: Insufficient documentation

## 2015-10-25 DIAGNOSIS — M25612 Stiffness of left shoulder, not elsewhere classified: Secondary | ICD-10-CM

## 2015-10-25 DIAGNOSIS — M7582 Other shoulder lesions, left shoulder: Secondary | ICD-10-CM | POA: Insufficient documentation

## 2015-10-26 ENCOUNTER — Encounter: Payer: Self-pay | Admitting: Physical Therapy

## 2015-10-26 NOTE — Therapy (Signed)
[facility] [address] [place], [place], [address] Phone: [phone]   Fax:  [phone]  Physical Therapy Treatment  Patient Details  Name: Mercedes [name] MRN: [id number] Date of Birth: Aug 08, 1981 Referring Provider: Dedra Skeens, PA-C  Encounter Date: 10/25/2015      PT End of Session - 10/26/15 1244    Visit Number 1   Number of Visits 1   Authorization - Visit Number 1   Authorization - Number of Visits 1   PT Start Time 1338   PT Stop Time 1430   PT Time Calculation (min) 52 min   Activity Tolerance Patient limited by pain   Behavior During Therapy [facility] for tasks assessed/performed      Past Medical History  Diagnosis Date  . Acid reflux     history of reflux, pt no longer has symptoms    Past Surgical History  Procedure Laterality Date  . Abdominal hysterectomy    . Cesarean section  2007 +2008    x 2  . Tubal ligation    . Cholecystectomy    . Tonsillectomy    . Hernia repair    . Laparoscopy N/A 09/14/2015    Procedure: LAPAROSCOPY DIAGNOSTIC;  Surgeon: Nadara Mustard, MD;  Location: [facility] ORS;  Service: Gynecology;  Laterality: N/A;  . Trachelectomy N/A 09/14/2015    Procedure: TRACHELECTOMY;  Surgeon: Nadara Mustard, MD;  Location: [facility] ORS;  Service: Gynecology;  Laterality: N/A;    There were no vitals filed for this visit.  Visit Diagnosis:  Pain in joint of left shoulder  Decreased ROM of left shoulder      Subjective Assessment - 10/26/15 1238    Subjective Pt reports L shoulder pain that has been ongoing for past 7 months. Pt reports falling on her L shoulder and having pain ever since. Pt reports pain at rest is a 4/10 and worst is 8/10 when using it. Pt reports using ice and voltaren gel with no relief. Pt received an injection 2 weeks ago with no relief.    Limitations House hold activities   Currently in Pain? Yes   Pain Score 4    Pain Location Shoulder   Pain Orientation Left   Pain  Descriptors / Indicators Aching;Burning;Constant;Pressure   Pain Type Chronic pain   Pain Onset More than a month ago   Pain Frequency Constant            [facility] PT Assessment - 10/26/15 0001    Assessment   Medical Diagnosis Arthralgia of L acrominoclavicualr joint   Referring Provider Dedra Skeens, PA-C   Onset Date/Surgical Date 02/21/15   Hand Dominance Right      OBJECTIVE:  MMT: 4+/5, L abduction pain limited AROM in sitting: Flexion R/L: 164 deg/159 deg, painful at 83 deg and greater; abduction R/L: 147 deg/151 deg. L shoulder ER: 64 deg in supine Manual: Grade I/II mobilizations to Glenohumeral joint and acronomioclavicular joint. Pt unable to handle pressure or motion. Ice to end session. Pt educated on HEP and importance of keeping arm moving.       PT Education - 10/26/15 1243    Education provided Yes   Education Details Pt given Upper trap/levator/pec stretching. Pt given yellow TB and exercises: biceps/triceps/shoulder flexion/shoulder extension/shoulder ER/scapular retraction.   Person(s) Educated Patient   Methods Explanation;Demonstration;Verbal cues;Handout   Comprehension Returned demonstration;Verbalized understanding            Plan - 10/26/15 1245  Clinical Impression Statement Pt is a pleasant 34 y.o F referred to PT for L shoulder pain. Pt reports falling on L arm 8 months ago and having pain when using her shoulder since then. Pt is able to move shoulder but has pain limiting arc with motion.  Pt is currently in 4/10 pain and has no radiating symptoms. Pt's MMT: 4+/5 bilaterally; L pain limited with flexion and abduction. Pt (+) Horizontal Adduction test, (-) Hawkins-[name], ER, full can special test. Pt's shoulder AROM in sitting: flexion R/L: 164 deg/159 deg (painful beginning at 83 deg); abduction R/L: 147 deg/151 deg; ER R/L: WFL/64 deg; IR WFL for both. Pt presents with hypomobility of the shoulder capsule and increased tenderness to palpation at  the glenohumeral joint. Pt is tender to palpation at the acromioclavicular joint. Attempted to perform grade I/II mobilitizations but pt unable to tolerate. Pt is able to perform 5 reps of exercises before increased complaints of pain. Pt's L upper trap is tight with stretching with relief found. Pt anterior capsule is tight secondary to pec minor length; pt is unable to tolerate pec minor stretching. Pt given stretching and strengthening HEP to help increase pain free ROM in L shoulder. Pt instructed to call PT in next 2-3 weeks after talking to Dedra Skeensodd Mundy, PA-C.    Pt will benefit from skilled therapeutic intervention in order to improve on the following deficits Decreased activity tolerance;Decreased range of motion;Pain;Hypomobility;Impaired flexibility;Improper body mechanics;Postural dysfunction   Rehab Potential Fair   PT Frequency 1x / week   PT Home Exercise Plan stretching and strengthening to promote L shoulder pain free motion: levator/upper trap/pec shoulder extension/flexion/ER/scap retraction/biceps/triceps   Consulted and Agree with Plan of Care Patient        Problem List There are no active problems to display for this patient.  Cammie McgeeMichael C Sherk, PT, DPT # [id number]   10/26/2015, 4:09 PM  [facility] [address] [place], [place], [address] Phone: [phone]   Fax:  [phone]  Name: Mercedes [name] MRN: [id number] Date of Birth: 14-Jul-1981

## 2015-11-27 ENCOUNTER — Other Ambulatory Visit: Payer: Self-pay | Admitting: Orthopedic Surgery

## 2015-11-27 DIAGNOSIS — M25512 Pain in left shoulder: Secondary | ICD-10-CM

## 2015-11-27 DIAGNOSIS — S4992XD Unspecified injury of left shoulder and upper arm, subsequent encounter: Secondary | ICD-10-CM

## 2015-12-15 ENCOUNTER — Ambulatory Visit
Admission: RE | Admit: 2015-12-15 | Discharge: 2015-12-15 | Disposition: A | Discharge status: Home / Self Care | Payer: Medicaid Other | Source: Ambulatory Visit | Attending: Orthopedic Surgery | Admitting: Orthopedic Surgery

## 2015-12-15 DIAGNOSIS — M19012 Primary osteoarthritis, left shoulder: Secondary | ICD-10-CM | POA: Insufficient documentation

## 2015-12-15 DIAGNOSIS — M25512 Pain in left shoulder: Secondary | ICD-10-CM | POA: Diagnosis present

## 2015-12-15 DIAGNOSIS — S4992XD Unspecified injury of left shoulder and upper arm, subsequent encounter: Secondary | ICD-10-CM

## 2016-01-12 ENCOUNTER — Encounter: Payer: Self-pay | Admitting: *Deleted

## 2016-01-12 ENCOUNTER — Other Ambulatory Visit: Payer: Medicaid Other

## 2016-01-17 ENCOUNTER — Encounter: Payer: Self-pay | Admitting: *Deleted

## 2016-01-17 ENCOUNTER — Inpatient Hospital Stay: Payer: Medicaid Other | Admitting: Certified Registered Nurse Anesthetist

## 2016-01-17 ENCOUNTER — Encounter
Admission: RE | Disposition: A | Discharge status: Home / Self Care | Payer: Self-pay | Source: Ambulatory Visit | Attending: Unknown Physician Specialty

## 2016-01-17 ENCOUNTER — Ambulatory Visit
Admission: RE | Admit: 2016-01-17 | Discharge: 2016-01-17 | Disposition: A | Discharge status: Home / Self Care | Payer: Medicaid Other | Source: Ambulatory Visit | Attending: Unknown Physician Specialty | Admitting: Unknown Physician Specialty

## 2016-01-17 DIAGNOSIS — Z90710 Acquired absence of both cervix and uterus: Secondary | ICD-10-CM | POA: Insufficient documentation

## 2016-01-17 DIAGNOSIS — Z889 Allergy status to unspecified drugs, medicaments and biological substances status: Secondary | ICD-10-CM | POA: Diagnosis not present

## 2016-01-17 DIAGNOSIS — Z9049 Acquired absence of other specified parts of digestive tract: Secondary | ICD-10-CM | POA: Diagnosis not present

## 2016-01-17 DIAGNOSIS — N920 Excessive and frequent menstruation with regular cycle: Secondary | ICD-10-CM | POA: Insufficient documentation

## 2016-01-17 DIAGNOSIS — Z8041 Family history of malignant neoplasm of ovary: Secondary | ICD-10-CM | POA: Insufficient documentation

## 2016-01-17 DIAGNOSIS — W19XXXS Unspecified fall, sequela: Secondary | ICD-10-CM | POA: Diagnosis not present

## 2016-01-17 DIAGNOSIS — Z881 Allergy status to other antibiotic agents status: Secondary | ICD-10-CM | POA: Insufficient documentation

## 2016-01-17 DIAGNOSIS — Z79899 Other long term (current) drug therapy: Secondary | ICD-10-CM | POA: Diagnosis not present

## 2016-01-17 DIAGNOSIS — T1490 Injury, unspecified: Secondary | ICD-10-CM | POA: Insufficient documentation

## 2016-01-17 DIAGNOSIS — M7542 Impingement syndrome of left shoulder: Secondary | ICD-10-CM | POA: Diagnosis not present

## 2016-01-17 DIAGNOSIS — R0602 Shortness of breath: Secondary | ICD-10-CM | POA: Insufficient documentation

## 2016-01-17 DIAGNOSIS — Z808 Family history of malignant neoplasm of other organs or systems: Secondary | ICD-10-CM | POA: Diagnosis not present

## 2016-01-17 DIAGNOSIS — Z800 Family history of malignant neoplasm of digestive organs: Secondary | ICD-10-CM | POA: Diagnosis not present

## 2016-01-17 DIAGNOSIS — Z6841 Body Mass Index (BMI) 40.0 and over, adult: Secondary | ICD-10-CM | POA: Insufficient documentation

## 2016-01-17 DIAGNOSIS — Z888 Allergy status to other drugs, medicaments and biological substances status: Secondary | ICD-10-CM | POA: Diagnosis not present

## 2016-01-17 DIAGNOSIS — Z818 Family history of other mental and behavioral disorders: Secondary | ICD-10-CM | POA: Diagnosis not present

## 2016-01-17 DIAGNOSIS — M12512 Traumatic arthropathy, left shoulder: Secondary | ICD-10-CM | POA: Insufficient documentation

## 2016-01-17 DIAGNOSIS — E669 Obesity, unspecified: Secondary | ICD-10-CM | POA: Insufficient documentation

## 2016-01-17 DIAGNOSIS — Z803 Family history of malignant neoplasm of breast: Secondary | ICD-10-CM | POA: Diagnosis not present

## 2016-01-17 DIAGNOSIS — D649 Anemia, unspecified: Secondary | ICD-10-CM | POA: Insufficient documentation

## 2016-01-17 HISTORY — DX: Obesity, unspecified: E66.9

## 2016-01-17 HISTORY — PX: SHOULDER ARTHROSCOPY WITH ROTATOR CUFF REPAIR AND SUBACROMIAL DECOMPRESSION: SHX5686

## 2016-01-17 HISTORY — DX: Reserved for inherently not codable concepts without codable children: IMO0001

## 2016-01-17 SURGERY — SHOULDER ARTHROSCOPY WITH ROTATOR CUFF REPAIR AND SUBACROMIAL DECOMPRESSION
Anesthesia: Regional | Site: Shoulder | Laterality: Left | Wound class: Clean

## 2016-01-17 MED ORDER — LABETALOL HCL 5 MG/ML IV SOLN
5.0000 mg | Freq: Once | INTRAVENOUS | Status: AC
  Administered 2016-01-17: 5 mg via INTRAVENOUS

## 2016-01-17 MED ORDER — ROCURONIUM BROMIDE 100 MG/10ML IV SOLN
INTRAVENOUS | Status: DC | PRN
  Administered 2016-01-17: 10 mg via INTRAVENOUS
  Administered 2016-01-17: 40 mg via INTRAVENOUS

## 2016-01-17 MED ORDER — SUGAMMADEX SODIUM 500 MG/5ML IV SOLN
INTRAVENOUS | Status: DC | PRN
  Administered 2016-01-17: 274 mg via INTRAVENOUS

## 2016-01-17 MED ORDER — DEXTROSE 5 % IV SOLN
3.0000 g | INTRAVENOUS | Status: DC | PRN
  Administered 2016-01-17: 3 g via INTRAVENOUS

## 2016-01-17 MED ORDER — LACTATED RINGERS IV SOLN
INTRAVENOUS | Status: DC
  Administered 2016-01-17: 09:00:00 via INTRAVENOUS

## 2016-01-17 MED ORDER — NORCO 5-325 MG PO TABS: 1.0000 | ORAL_TABLET | Freq: Four times a day (QID) | ORAL | Status: AC | PRN

## 2016-01-17 MED ORDER — MIDAZOLAM HCL 5 MG/5ML IJ SOLN
1.0000 mg | Freq: Once | INTRAMUSCULAR | Status: AC
  Administered 2016-01-17: 1 mg via INTRAVENOUS

## 2016-01-17 MED ORDER — ROPIVACAINE HCL 5 MG/ML IJ SOLN
INTRAMUSCULAR | Status: AC
  Administered 2016-01-17: 30 mL via PERINEURAL
  Filled 2016-01-17: qty 40

## 2016-01-17 MED ORDER — LIDOCAINE HCL (PF) 1 % IJ SOLN
INTRAMUSCULAR | Status: AC
  Administered 2016-01-17: 5 mL
  Filled 2016-01-17: qty 5

## 2016-01-17 MED ORDER — ACETAMINOPHEN 10 MG/ML IV SOLN
INTRAVENOUS | Status: AC
  Filled 2016-01-17: qty 100

## 2016-01-17 MED ORDER — PROMETHAZINE HCL 25 MG/ML IJ SOLN
INTRAMUSCULAR | Status: AC
  Administered 2016-01-17: 6.25 mg via INTRAVENOUS
  Filled 2016-01-17: qty 1

## 2016-01-17 MED ORDER — FAMOTIDINE 20 MG PO TABS
ORAL_TABLET | ORAL | Status: AC
  Administered 2016-01-17: 20 mg via ORAL
  Filled 2016-01-17: qty 1

## 2016-01-17 MED ORDER — MIDAZOLAM HCL 5 MG/5ML IJ SOLN
INTRAMUSCULAR | Status: AC
  Administered 2016-01-17: 1 mg via INTRAVENOUS
  Filled 2016-01-17: qty 5

## 2016-01-17 MED ORDER — EPINEPHRINE HCL 1 MG/ML IJ SOLN
INTRAMUSCULAR | Status: AC
  Filled 2016-01-17: qty 1

## 2016-01-17 MED ORDER — DEXAMETHASONE SODIUM PHOSPHATE 10 MG/ML IJ SOLN
INTRAMUSCULAR | Status: DC | PRN
  Administered 2016-01-17: 10 mg via INTRAVENOUS

## 2016-01-17 MED ORDER — SODIUM CHLORIDE 0.9 % IJ SOLN
INTRAMUSCULAR | Status: AC
  Filled 2016-01-17: qty 10

## 2016-01-17 MED ORDER — ONDANSETRON HCL 4 MG/2ML IJ SOLN
INTRAMUSCULAR | Status: DC | PRN
  Administered 2016-01-17: 4 mg via INTRAVENOUS

## 2016-01-17 MED ORDER — FAMOTIDINE 20 MG PO TABS
20.0000 mg | ORAL_TABLET | Freq: Once | ORAL | Status: AC
  Administered 2016-01-17: 20 mg via ORAL

## 2016-01-17 MED ORDER — PROMETHAZINE HCL 25 MG/ML IJ SOLN
6.2500 mg | INTRAMUSCULAR | Status: DC | PRN
  Administered 2016-01-17: 6.25 mg via INTRAVENOUS

## 2016-01-17 MED ORDER — FENTANYL CITRATE (PF) 250 MCG/5ML IJ SOLN
INTRAMUSCULAR | Status: DC | PRN
  Administered 2016-01-17 (×2): 50 ug via INTRAVENOUS

## 2016-01-17 MED ORDER — SUCCINYLCHOLINE CHLORIDE 20 MG/ML IJ SOLN
INTRAMUSCULAR | Status: DC | PRN
  Administered 2016-01-17: 100 mg via INTRAVENOUS

## 2016-01-17 MED ORDER — EPINEPHRINE HCL 1 MG/ML IJ SOLN
INTRAMUSCULAR | Status: DC | PRN
  Administered 2016-01-17: 5 mL

## 2016-01-17 MED ORDER — PROPOFOL 10 MG/ML IV BOLUS
INTRAVENOUS | Status: DC | PRN
  Administered 2016-01-17: 200 mg via INTRAVENOUS

## 2016-01-17 MED ORDER — LIDOCAINE HCL (CARDIAC) 20 MG/ML IV SOLN
INTRAVENOUS | Status: DC | PRN
  Administered 2016-01-17: 80 mg via INTRAVENOUS

## 2016-01-17 MED ORDER — LABETALOL HCL 5 MG/ML IV SOLN
INTRAVENOUS | Status: AC
  Administered 2016-01-17: 5 mg via INTRAVENOUS
  Filled 2016-01-17: qty 4

## 2016-01-17 MED ORDER — FENTANYL CITRATE (PF) 100 MCG/2ML IJ SOLN: 25.0000 ug | INTRAMUSCULAR | Status: DC | PRN

## 2016-01-17 MED ORDER — ACETAMINOPHEN 10 MG/ML IV SOLN
INTRAVENOUS | Status: DC | PRN
  Administered 2016-01-17: 1000 mg via INTRAVENOUS

## 2016-01-17 SURGICAL SUPPLY — 70 items
ADAPTER IRRIG TUBE 2 SPIKE SOL (ADAPTER) ×3 IMPLANT
ARTHROWAND PARAGON T2 (SURGICAL WAND)
BLADE ABRADER 4.5 (BLADE) IMPLANT
BLADE SHAVER 4.5X7 STR FR (MISCELLANEOUS) ×3 IMPLANT
BLADE SURG 15 STRL LF DISP TIS (BLADE) IMPLANT
BLADE SURG 15 STRL SS (BLADE)
BUR ABRADER 4.0 W/FLUTE AQUA (MISCELLANEOUS) ×1 IMPLANT
BUR ABRADER 5.5 BLK (MISCELLANEOUS) ×1 IMPLANT
BUR BR 5.5 WIDE MOUTH (BURR) ×3 IMPLANT
BURR ABRADER 4.0 W/FLUTE AQUA (MISCELLANEOUS) ×3
BURR ABRADER 5.5 BLK (MISCELLANEOUS) ×3
CANNULA 8.5X75 THRED (CANNULA) ×3 IMPLANT
CANNULA SHAVER 8MMX76MM (CANNULA) IMPLANT
CAP LOCK ULTRA CANNULA (MISCELLANEOUS) IMPLANT
CHLORAPREP W/TINT 26ML (MISCELLANEOUS) ×6 IMPLANT
DRAPE STERI 35X30 U-POUCH (DRAPES) ×3 IMPLANT
ELECT REM PT RETURN 9FT ADLT (ELECTROSURGICAL) ×3
ELECTRODE REM PT RTRN 9FT ADLT (ELECTROSURGICAL) ×1 IMPLANT
GAUZE SPONGE 4X4 12PLY STRL (GAUZE/BANDAGES/DRESSINGS) ×3 IMPLANT
GLOVE BIO SURGEON STRL SZ7.5 (GLOVE) ×3 IMPLANT
GLOVE BIO SURGEON STRL SZ8 (GLOVE) ×3 IMPLANT
GLOVE INDICATOR 8.0 STRL GRN (GLOVE) ×3 IMPLANT
GOWN STRL REUS W/ TWL LRG LVL3 (GOWN DISPOSABLE) ×1 IMPLANT
GOWN STRL REUS W/TWL LRG LVL3 (GOWN DISPOSABLE) ×2
GOWN STRL REUS W/TWL LRG LVL4 (GOWN DISPOSABLE) ×3 IMPLANT
IV LACTATED RINGER IRRG 3000ML (IV SOLUTION) ×10
IV LR IRRIG 3000ML ARTHROMATIC (IV SOLUTION) ×5 IMPLANT
KIT SHOULDER TRACTION (DRAPES) ×3 IMPLANT
MANIFOLD 4PT FOR NEPTUNE1 (MISCELLANEOUS) ×3 IMPLANT
NEEDLE 18GX1X1/2 (RX/OR ONLY) (NEEDLE) IMPLANT
NEEDLE MAYO CATGUT SZ 1.5 (NEEDLE)
NEEDLE MAYO CATGUT SZ 2 (NEEDLE) IMPLANT
NEEDLE SPNL 18GX3.5 QUINCKE PK (NEEDLE) IMPLANT
PACK ARTHROSCOPY SHOULDER (MISCELLANEOUS) ×3 IMPLANT
PASSER SUT CAPTURE FIRST (SUTURE) IMPLANT
SET TUBE SUCT SHAVER OUTFL 24K (TUBING) ×3 IMPLANT
SOL PREP PVP 2OZ (MISCELLANEOUS) ×3
SOLUTION PREP PVP 2OZ (MISCELLANEOUS) ×1 IMPLANT
STAPLER SKIN PROX 35W (STAPLE) IMPLANT
SUT ETHIBOND NAB CT1 #1 30IN (SUTURE) IMPLANT
SUT ETHILON 3-0 FS-10 30 BLK (SUTURE) ×3
SUT PDS AB 1 CT1 27 (SUTURE) IMPLANT
SUT PERFECTPASSER WHITE CART (SUTURE) IMPLANT
SUT PROLENE 2 0 CT2 30 (SUTURE) IMPLANT
SUT SMART STITCH CARTRIDGE (SUTURE) IMPLANT
SUT TICRON 2-0 30IN 311381 (SUTURE) IMPLANT
SUT VIC AB 0 CT1 36 (SUTURE) IMPLANT
SUT VIC AB 0 CT2 27 (SUTURE) IMPLANT
SUT VIC AB 2-0 CT1 27 (SUTURE)
SUT VIC AB 2-0 CT1 TAPERPNT 27 (SUTURE) IMPLANT
SUT VIC AB 2-0 CT2 27 (SUTURE) IMPLANT
SUT VIC AB 2-0 SH 27 (SUTURE)
SUT VIC AB 2-0 SH 27XBRD (SUTURE) IMPLANT
SUT VIC AB 3-0 SH 27 (SUTURE)
SUT VIC AB 3-0 SH 27X BRD (SUTURE) IMPLANT
SUTURE EHLN 3-0 FS-10 30 BLK (SUTURE) ×1 IMPLANT
SUTURE MAGNUM WIRE 2X48 BLK (SUTURE) IMPLANT
SYRINGE 10CC LL (SYRINGE) IMPLANT
TAPE MICROFOAM 4IN (TAPE) ×3 IMPLANT
TUBING ARTHRO INFLOW-ONLY STRL (TUBING) ×3 IMPLANT
WAND 30 DEG SABER W/CORD (SURGICAL WAND) IMPLANT
WAND ARTHRO PARAGON T2 (SURGICAL WAND) IMPLANT
WAND COVAC 50 IFS (MISCELLANEOUS) IMPLANT
WAND COVATOR 20 (MISCELLANEOUS) IMPLANT
WAND HAND CNTRL MULTIVAC 50 (MISCELLANEOUS) IMPLANT
WAND HAND CNTRL MULTIVAC 90 (MISCELLANEOUS) ×3 IMPLANT
WAND TENDON TOPAZ 0 ANGL (MISCELLANEOUS) IMPLANT
WAND TOPAZ EPF  WAS Q (MISCELLANEOUS)
WAND TOPAZ EPF WAS Q (MISCELLANEOUS) IMPLANT
WIRE MAGNUM (SUTURE) IMPLANT

## 2016-01-17 NOTE — Op Note (Signed)
Expand All Collapse All   08/07/2015  1:32 PM  Patient:[name], Mercedes J.  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  Findings: As above.   Complications: None  Estimated blood loss: negligible  Tourniquet time: None  Drains: None   Brief clinical note: The patient's symptoms have progressed despite medications, activity modification, etc. The patient's history and examination are consistent with impingement symptomatology and symptomatic traumatic AC joint arthritis These findings were confirmed by MRI scan. The patient presents at this time for definitive management of these shoulder symptoms.  Procedure: The patient was brought into the operating room and placed in the supine position. The patient then underwent general endotracheal intubation and anesthesia before being repositioned in the lateral decubitus position. The left shoulder was prepped and draped in usual fashion for shoulder procedure. The shoulder was supported with the Acufex shoulder suspension device.12 pounds of traction was utilized. Preoperative IV Kefzol was administered. A timeout was performed . A posterior portal was created and the glenohumeral joint thoroughly inspected revealing no degenerative arthritis and an intact but slightly frayed labrum. There was no fraying of the labral attachment of the long head of the biceps tendon..    The scope was repositioned through the posterior portal into the subacromial space. A separate lateral portal was created using an outside-in technique. An ArthroCare 90 wand followed by a 4.0 full-radius resector was introduced and  used to perform a subtotal bursectomy. The patient did have a significant anterior subacromial spur but no obvious rotator cuff tear was appreciated. The ArthroCare wand was then inserted and used to remove the periosteal tissue off the undersurface of the anterior third of the acromion as well as to recess the coracoacromial ligament from its attachment along the anterior and lateral margins of the acromion. I introduced a 5.5 acromionizer through the posterior portal and used it to perform the decompression by removing the undersurface of the anterior third of the acromion. I then resected about 8-10 mm of the distal clavicle using arthroscopic burs. This was facilitated by an anterior-superior portal at the [facility] joint. The instruments were then removed from the subacromial space.  The portal sites were closed using 3-O nylon sutures. A sterile bulky dressing was applied to the shoulder . This was followed by a sling. The patient was then awakened, extubated, and returned to the recovery room in satisfactory condition after tolerating the procedure well.  Blood loss was negligible.

## 2016-01-17 NOTE — Anesthesia Preprocedure Evaluation (Signed)
Anesthesia Evaluation  Patient identified by MRN, date of birth, ID band Patient awake    Reviewed: Allergy & Precautions, H&P , NPO status , Patient's Chart, lab work & pertinent test results, reviewed documented beta blocker date and time   Airway Mallampati: III  TM Distance: >3 FB Neck ROM: full    Dental no notable dental hx. (+) Missing   Pulmonary neg pulmonary ROS,    Pulmonary exam normal breath sounds clear to auscultation       Cardiovascular Exercise Tolerance: Good negative cardio ROS Normal cardiovascular exam Rhythm:regular Rate:Normal     Neuro/Psych negative neurological ROS  negative psych ROS   GI/Hepatic negative GI ROS, Neg liver ROS,   Endo/Other  neg diabetesMorbid obesity  Renal/GU negative Renal ROS  negative genitourinary   Musculoskeletal   Abdominal   Peds  Hematology negative hematology ROS (+)   Anesthesia Other Findings Past Medical History:   Shortness of breath dyspnea                                  Obesity                                                      Reproductive/Obstetrics negative OB ROS                             Anesthesia Physical Anesthesia Plan  ASA: III  Anesthesia Plan: General and Regional   Post-op Pain Management: MAC Combined w/ Regional for Post-op pain   Induction:   Airway Management Planned:   Additional Equipment:   Intra-op Plan:   Post-operative Plan:   Informed Consent: I have reviewed the patients History and Physical, chart, labs and discussed the procedure including the risks, benefits and alternatives for the proposed anesthesia with the patient or authorized representative who has indicated his/her understanding and acceptance.   Dental Advisory Given  Plan Discussed with: Anesthesiologist, CRNA and Surgeon  Anesthesia Plan Comments:         Anesthesia Quick Evaluation

## 2016-01-17 NOTE — Discharge Instructions (Signed)
General Anesthesia, Care After °Refer to this sheet in the next few weeks. These instructions provide you with information on caring for yourself after your procedure. Your health care provider may also give you more specific instructions. Your treatment has been planned according to current medical practices, but problems sometimes occur. Call your health care provider if you have any problems or questions after your procedure. °WHAT TO EXPECT AFTER THE PROCEDURE °After the procedure, it is typical to experience: °· Sleepiness. °· Nausea and vomiting. °HOME CARE INSTRUCTIONS °· For the first 24 hours after general anesthesia: °¨ Have a responsible person with you. °¨ Do not drive a car. If you are alone, do not take public transportation. °¨ Do not drink alcohol. °¨ Do not take medicine that has not been prescribed by your health care provider. °¨ Do not sign important papers or make important decisions. °¨ You may resume a normal diet and activities as directed by your health care provider. °· Change bandages (dressings) as directed. °· If you have questions or problems that seem related to general anesthesia, call the hospital and ask for the anesthetist or anesthesiologist on call. °SEEK MEDICAL CARE IF: °· You have nausea and vomiting that continue the day after anesthesia. °· You develop a rash. °SEEK IMMEDIATE MEDICAL CARE IF:  °· You have difficulty breathing. °· You have chest pain. °· You have any allergic problems. ° °Document Released: 03/17/2001 Document Revised: 12/14/2013 Document Reviewed: 06/24/2013 °[employer]® Patient Information ©2015 [employer]. This information is not intended to replace advice given to you by your health care provider. Make sure you discuss any questions you have with your health care provider. °Diet: As you were doing prior to hospitalization  ° °Dressing:  You may remove your dressing 3 days after surgery. Then apply waterproof bandaids. You can shower at this time but need  to change the waterproof bandaids after showering. ° °Activity:  Increase activity slowly as tolerated. Can drive when comfortable.   ° °Sling: D/C when comfortable  °  °RTC: 1 week ° °Ice pack as needed  ° °To prevent constipation: you may use a stool softener such as - °Miralax (over the counter) for constipation as needed.   ° °To prevent venous clotting Take one 81 mg. ASA tablet  2X per day for about 2 weeks post surgery. ° °Precautions:  If you experience chest pain or shortness of breath - call 911 immediately for transfer to the hospital emergency department!! ° °If you develop a fever greater that 101 F, purulent drainage from wound, increased redness or drainage from wound, or calf pain -- Call the office at [phone]                                             °Follow- Up Appointment:  Please call for an appointment to be seen in 1 wk. °

## 2016-01-17 NOTE — Anesthesia Procedure Notes (Addendum)
Anesthesia Regional Block:  Interscalene brachial plexus block  Pre-Anesthetic Checklist: ,, timeout performed, Correct Patient, Correct Site, Correct Laterality, Correct Procedure, Correct Position, site marked, Risks and benefits discussed,  Surgical consent,  Pre-op evaluation,  At surgeon's request and post-op pain management  Laterality: Left and Upper  Prep: chloraprep       Needles:  Injection technique: Single-shot  Needle Type: Stimiplex     Needle Length: 5cm 5 cm Needle Gauge: 22 and 22 G    Additional Needles:  Procedures: ultrasound guided (picture in chart) Interscalene brachial plexus block Narrative:  Start time: 01/17/2016 8:50 AM End time: 01/17/2016 8:55 AM Injection made incrementally with aspirations every 5 mL.  Performed by: Personally  Anesthesiologist: Lenard Simmer  Additional Notes: Functioning IV was confirmed and monitors were applied.  A 50mm 22ga Stimuplex needle was used. Sterile prep and drape,hand hygiene and sterile gloves were used.  Negative aspiration and negative test dose prior to incremental administration of local anesthetic. The patient tolerated the procedure well.      Procedure Name: Intubation Date/Time: 01/17/2016 9:23 AM Performed by: Chong Sicilian Pre-anesthesia Checklist: Patient identified, Emergency Drugs available, Suction available, Patient being monitored and Timeout performed Patient Re-evaluated:Patient Re-evaluated prior to inductionOxygen Delivery Method: Circle system utilized Preoxygenation: Pre-oxygenation with 100% oxygen Intubation Type: IV induction Ventilation: Mask ventilation without difficulty Laryngoscope Size: Mac and 3 Grade View: Grade II Tube type: Oral Tube size: 7.0 mm Number of attempts: 1 Airway Equipment and Method: Stylet Placement Confirmation: ETT inserted through vocal cords under direct vision,  positive ETCO2 and breath sounds checked- equal and bilateral Secured at: 18 cm Tube  secured with: Tape Dental Injury: Teeth and Oropharynx as per pre-operative assessment

## 2016-01-17 NOTE — H&P (Signed)
  H and P reviewed. No changes. Uploaded at later date. 

## 2016-01-17 NOTE — Transfer of Care (Signed)
Immediate Anesthesia Transfer of Care Note  Patient: Mercedes [name]  Procedure(s) Performed: Procedure(s): SHOULDER ARTHROSCOPY WITH  SUBACROMIAL DECOMPRESSION and distal clavicle excision (Left)  Patient Location: PACU  Anesthesia Type:General  Level of Consciousness: awake, alert  and oriented  Airway & Oxygen Therapy: Patient Spontanous Breathing and Patient connected to face mask oxygen  Post-op Assessment: Report given to RN and Post -op Vital signs reviewed and stable  Post vital signs: Reviewed and stable  Last Vitals: 11:40 96.8 temp 100% sat 101hr 152/100 bp 20 resp  Filed Vitals:   01/17/16 0856 01/17/16 0901  BP: 150/106 143/94  Pulse: 81 83  Temp:    Resp: 24 19    Complications: No apparent anesthesia complications

## 2016-01-18 NOTE — Anesthesia Postprocedure Evaluation (Signed)
Anesthesia Post Note  Patient: Mercedes [name]  Procedure(s) Performed: Procedure(s) (LRB): SHOULDER ARTHROSCOPY WITH  SUBACROMIAL DECOMPRESSION and distal clavicle excision (Left)  Patient location during evaluation: PACU Anesthesia Type: General Level of consciousness: awake and alert Pain management: pain level controlled Vital Signs Assessment: post-procedure vital signs reviewed and stable Respiratory status: spontaneous breathing, nonlabored ventilation, respiratory function stable and patient connected to nasal cannula oxygen Cardiovascular status: blood pressure returned to baseline and stable Postop Assessment: no signs of nausea or vomiting Anesthetic complications: no    Last Vitals:  Filed Vitals:   01/17/16 1259 01/17/16 1400  BP: 146/97 146/94  Pulse: 92 97  Temp: 36.1 C   Resp: 16 16    Last Pain:  Filed Vitals:   01/17/16 1410  PainSc: 4                  Lenard Simmer

## 2016-12-26 IMAGING — CT CT ABD-PELV W/ CM
2 of 5 series · 15 of 46 positions shown, 17 images · IV contrast (omnipaque)
Comparison: 08/23/2014

CLINICAL DATA: The patient has a pulling feeling. Pressure and
large lower pelvic soft tissue bruise with blood flow pooling in it.
Status post hysterectomy. History of cholecystectomy, C-sections and
hernia repair.

EXAM:
CT ABDOMEN AND PELVIS WITH CONTRAST
TECHNIQUE: Multidetector CT imaging of the abdomen and pelvis was performed
using the standard protocol following bolus administration of
intravenous contrast.
CONTRAST:  115 cc Omnipaque 300

[Series 4: delay · axial · delayed · 0.98mm/px · z∈[-505,-35]mm · 12 of 112 slices shown, 14 images]
[im 9/112  soft-tissue]
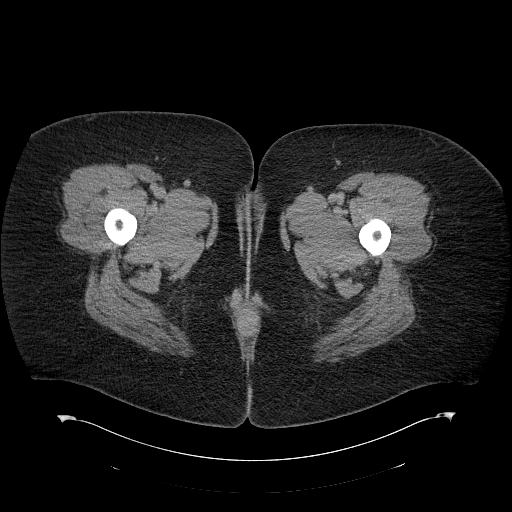
[im 9/112  bone]
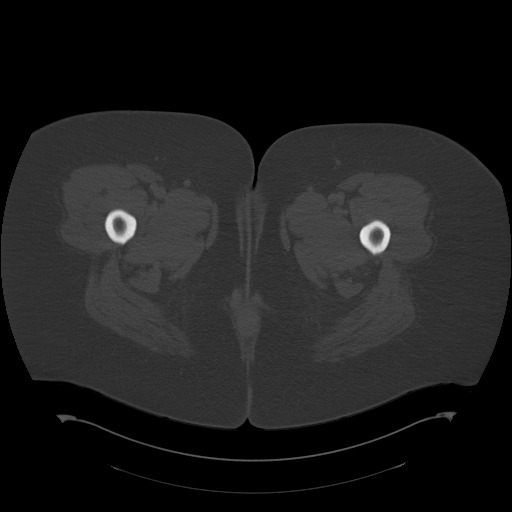
[im 18/112  soft-tissue]
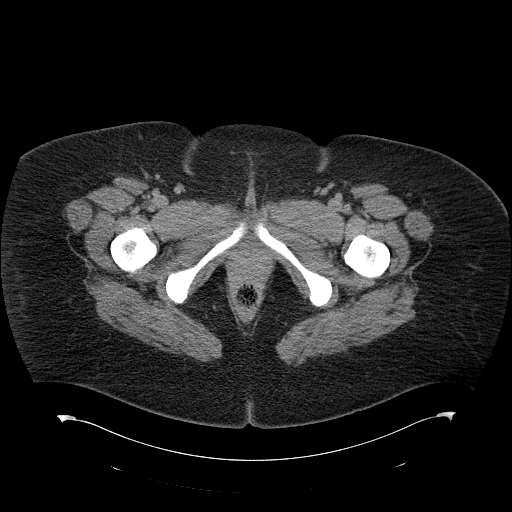
[im 26/112  soft-tissue]
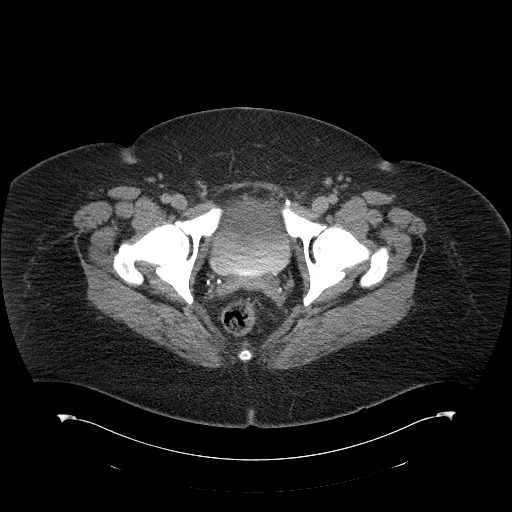
[im 35/112  soft-tissue]
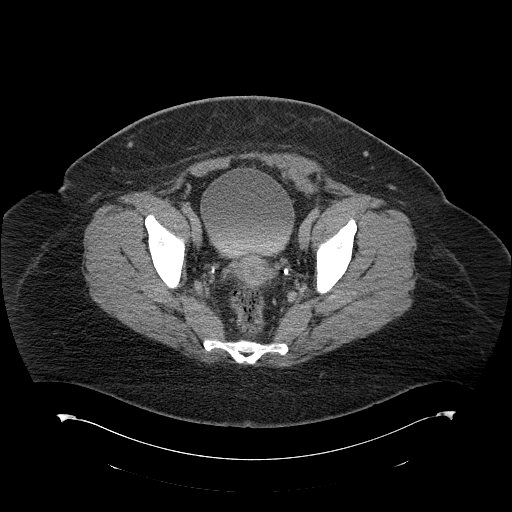
[im 43/112  soft-tissue]
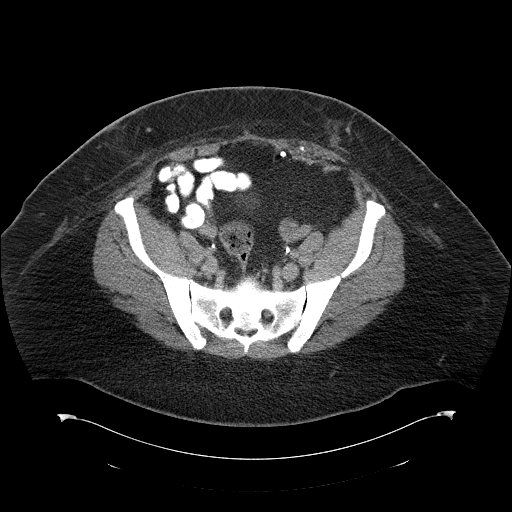
[im 52/112  soft-tissue]
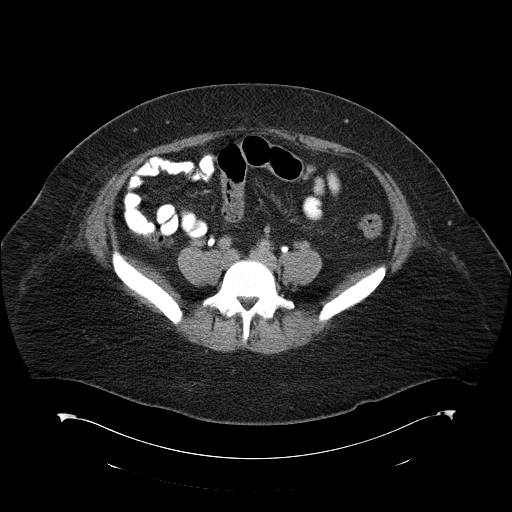
[im 60/112  soft-tissue]
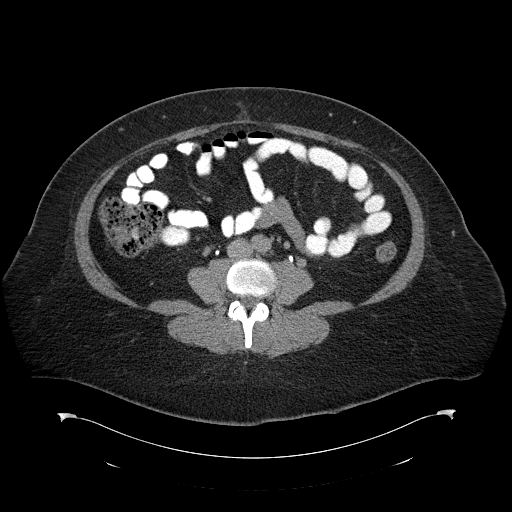
[im 69/112  soft-tissue]
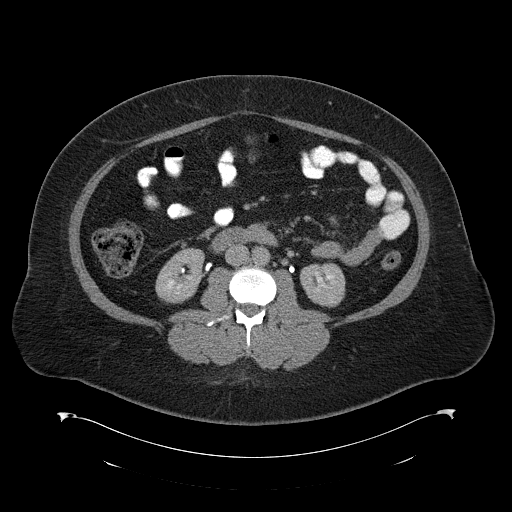
[im 77/112  soft-tissue]
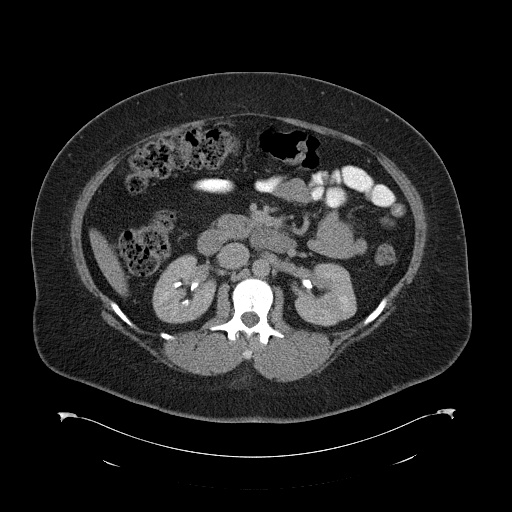
[im 77/112  bone]
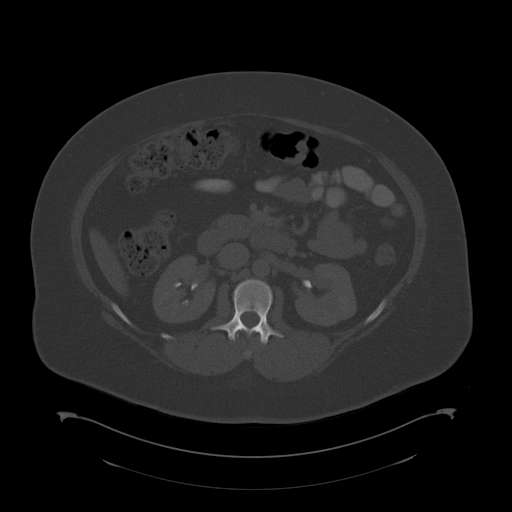
[im 86/112  soft-tissue]
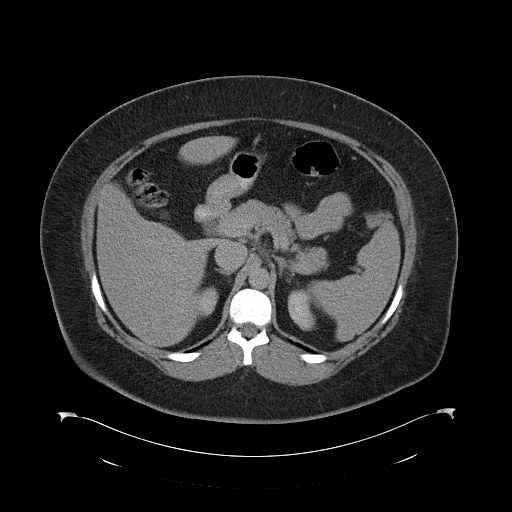
[im 94/112  soft-tissue]
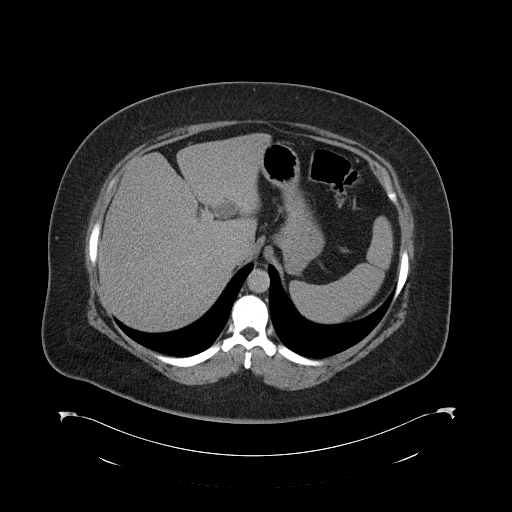
[im 103/112  soft-tissue]
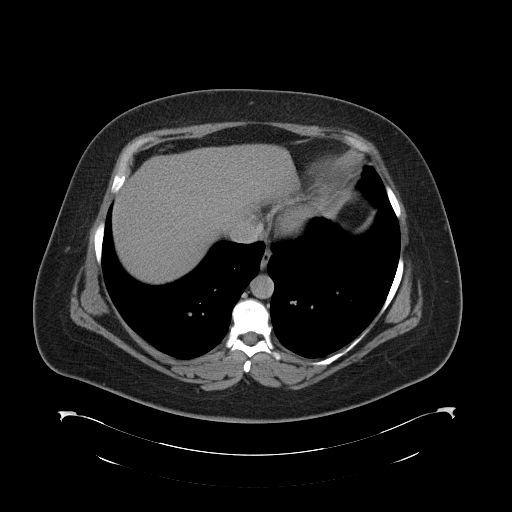

[Series 6: cor routine abd pel with · coronal · 0.81mm/px · 3 of 176 slices shown]
[im 59/176  soft-tissue]
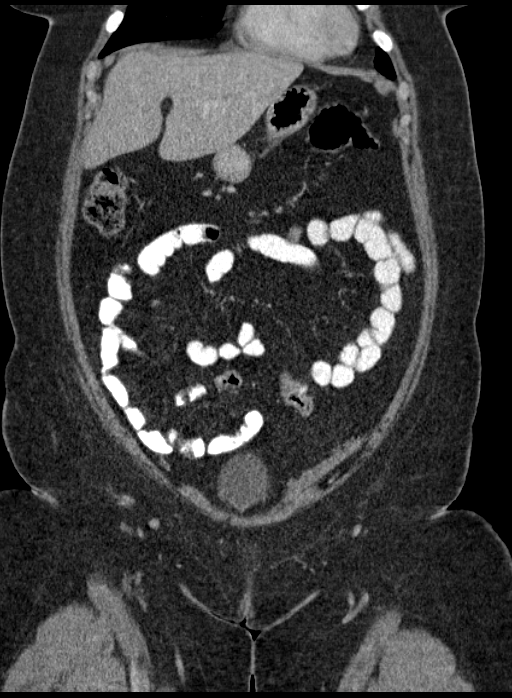
[im 78/176  soft-tissue]
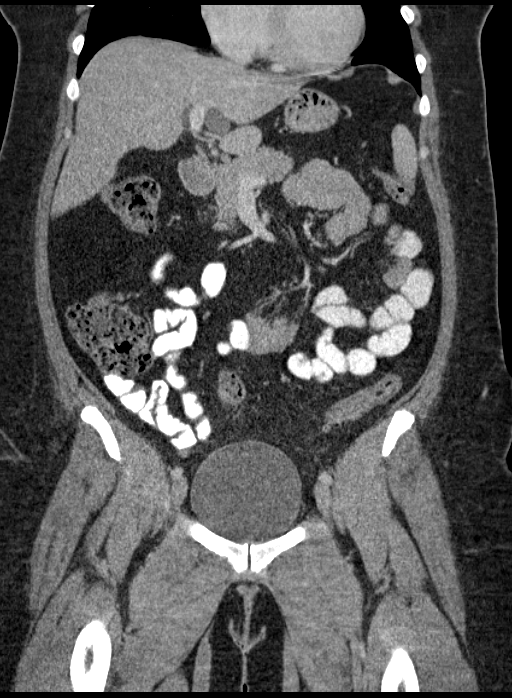
[im 98/176  soft-tissue]
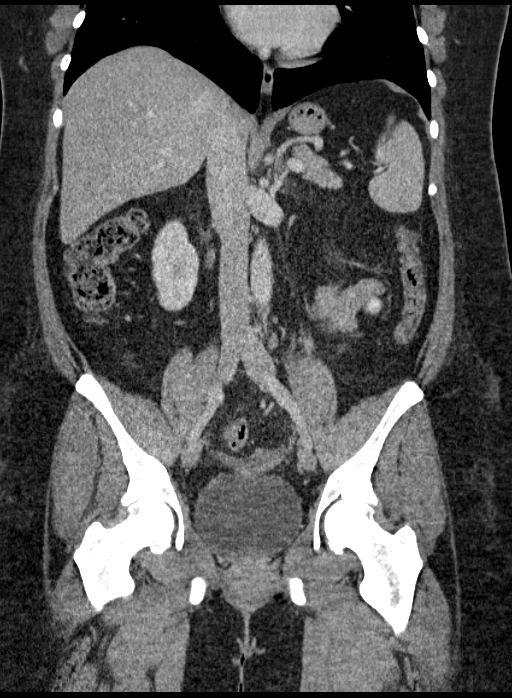

[15 of 46 positions shown; findings below may reference images not displayed]

FINDINGS: Lower chest: Lung bases are unremarkable.  Heart size is normal.

Upper abdomen: Status post cholecystectomy. There is mild dilatation
of intra hepatic ducts. Stable cyst along the posterior aspect of
the left hepatic lobe measuring 2.2 x 2.8 cm. No suspicious liver
lesions are identified. No focal abnormality identified within the
spleen, pancreas, or adrenal glands. There is symmetric excretion
within both kidneys. No hydronephrosis or ureteral obstruction.

Gastrointestinal tract: The stomach and small bowel loops appear
normal. The appendix is well seen and has a normal appearance. There
are scattered colonic diverticula but no associated inflammation.

Pelvis: No adnexal mass or free pelvic fluid.  Hysterectomy.

Retroperitoneum: No retroperitoneal or mesenteric adenopathy.

Abdominal wall: Previous left inguinal hernia repair. Mild strandy
changes in the lower anterior abdominal wall, left greater than
right. Question of recent trauma. The changes in the left anterior
abdominal wall are anterior to the stable appearing hernia repair.
No significant hematoma identified.

Osseous structures: Negative.  No acute fracture.
IMPRESSION: 1. Status postcholecystectomy and hysterectomy.
2. Small cyst within the liver, stable in appearance.
3. Normal appendix and bowel loops.
4. Postoperative changes in the left inguinal anterior abdominal
wall region. Mild strain the changes in the subcutaneous fat
anterior to the hernia repair and lower anterior abdominal wall
without significant hematoma.

## 2017-05-03 IMAGING — CR DG WRIST COMPLETE 3+V*R*
1 series · 4 of 4 positions shown · non-contrast
Comparison: None.

CLINICAL DATA: Tripped while pushing lawnmower downhill

EXAM:
RIGHT WRIST - COMPLETE 3+ VIEW

[Series 1: dg wrist complete right · 0.14mm/px · 4 of 4 slices shown]
[im 1/4]
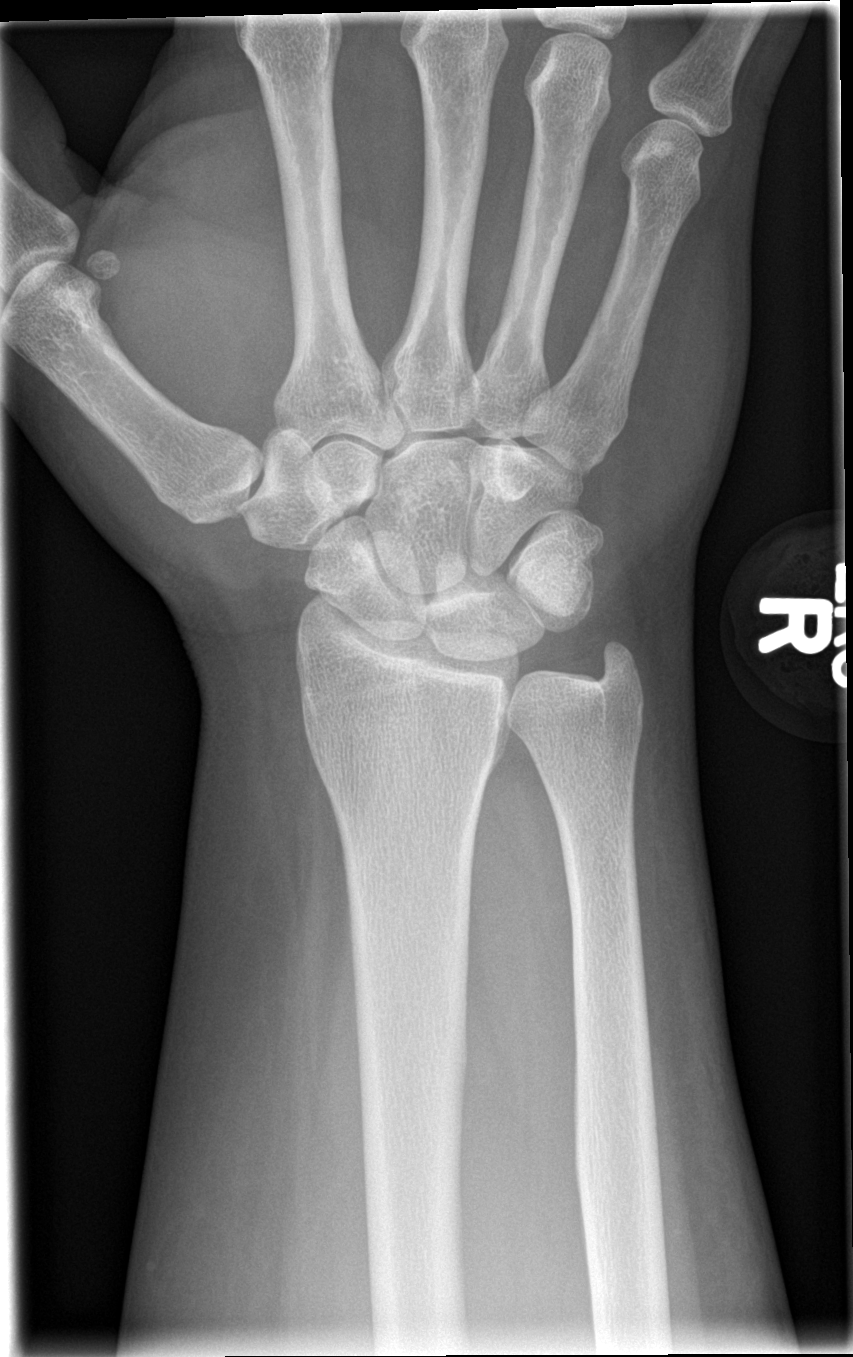
[im 2/4]
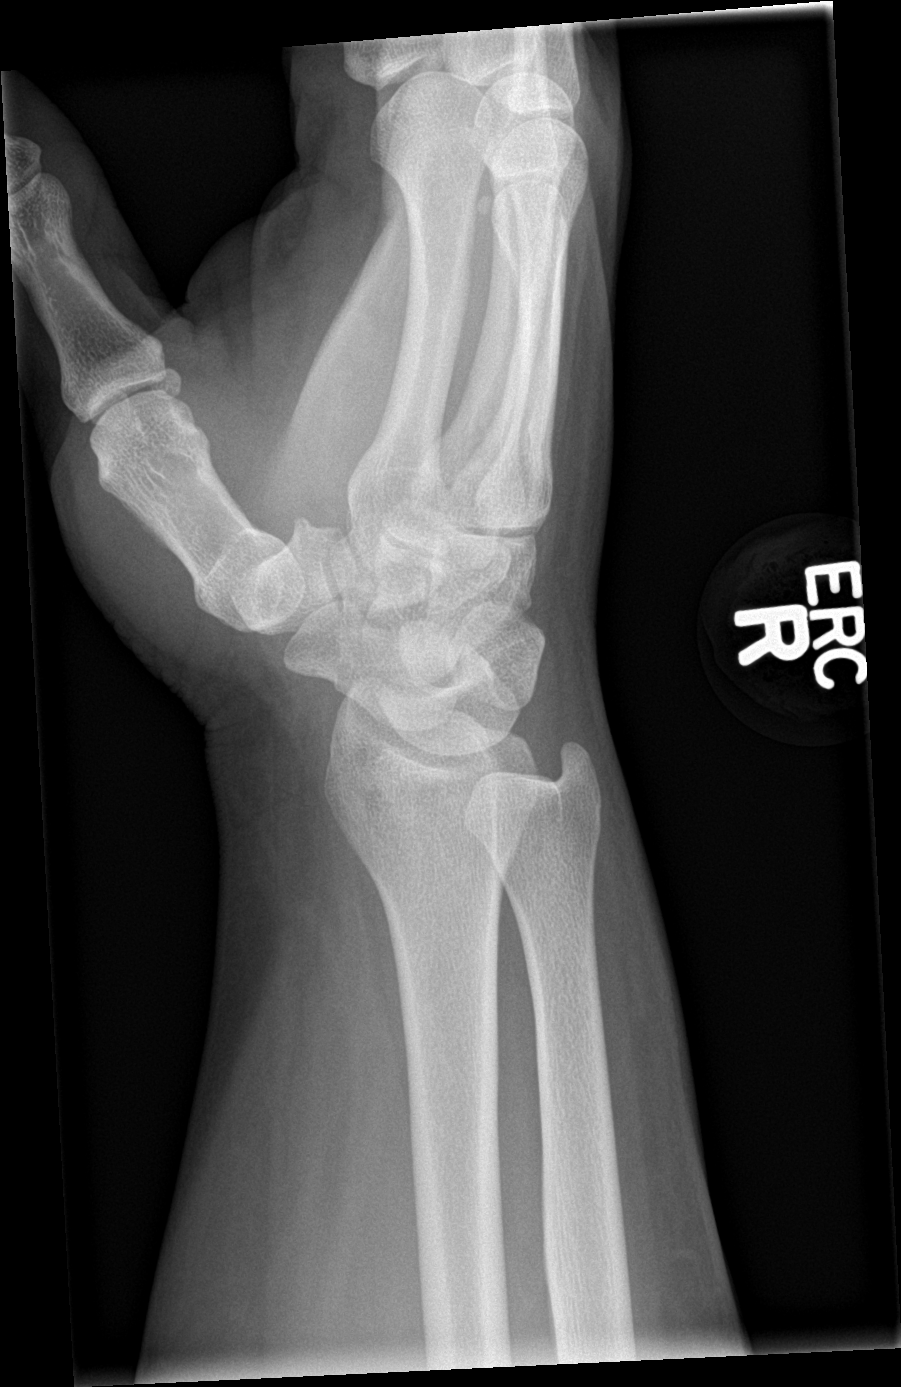
[im 3/4]
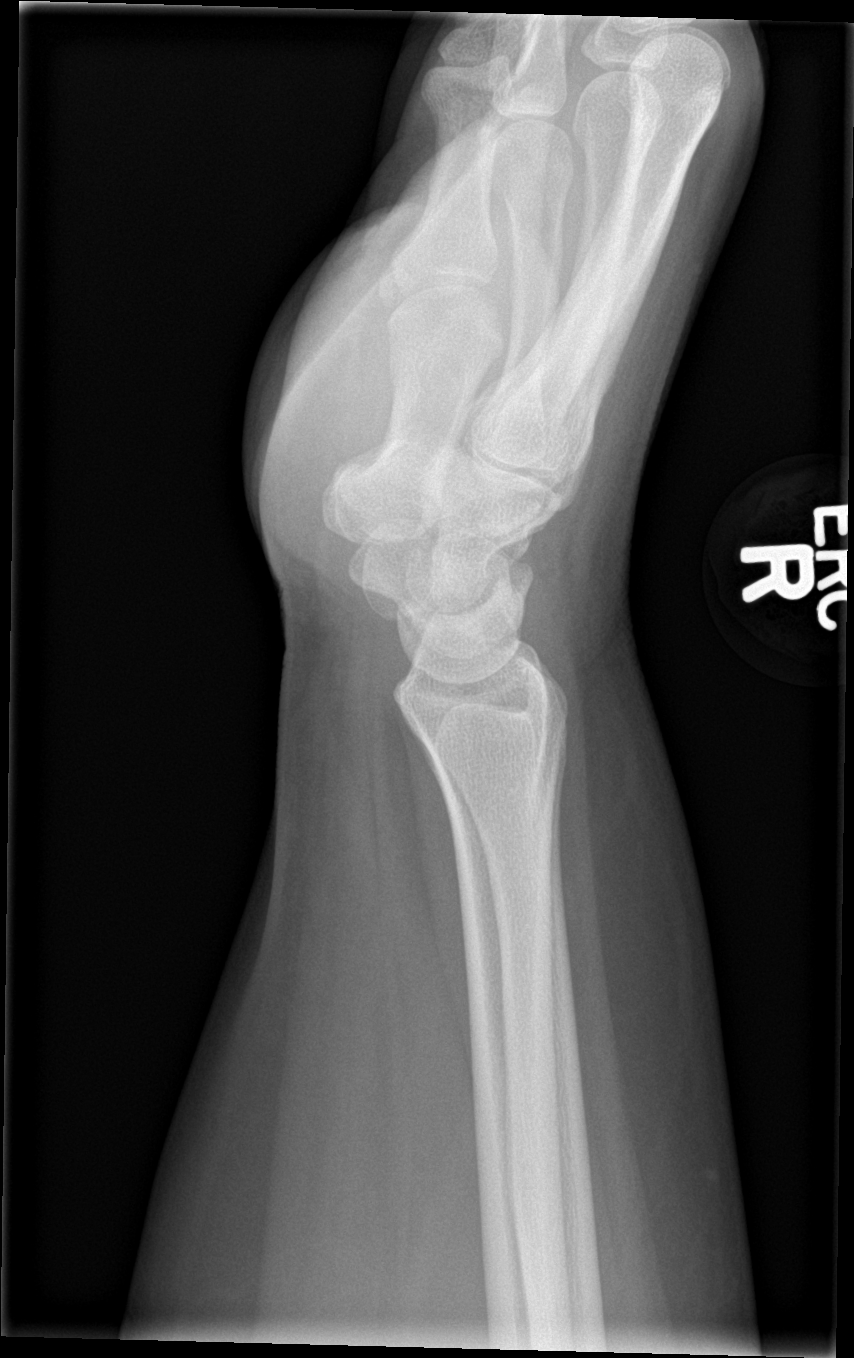
[im 4/4]
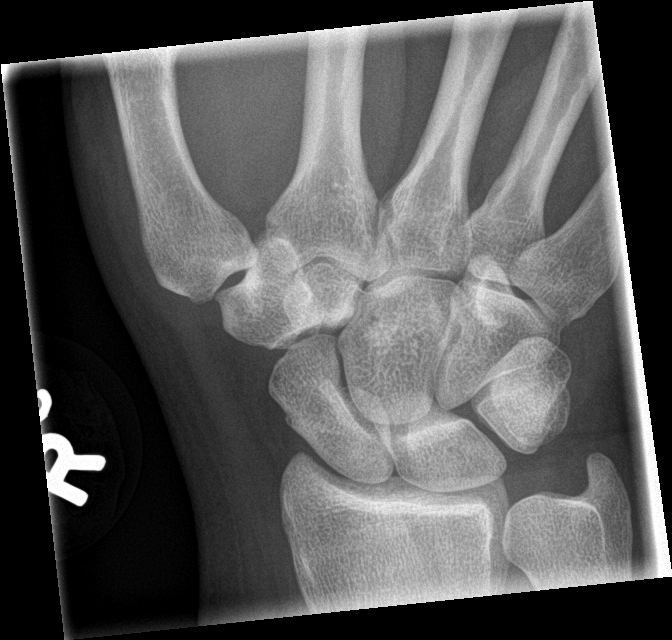

[4 of 4 positions shown; findings below may reference images not displayed]

FINDINGS: There is no evidence of fracture or dislocation. There is no
evidence of arthropathy or other focal bone abnormality. Soft
tissues are unremarkable.
IMPRESSION: Negative.

## 2017-05-03 IMAGING — CT CT ABD-PELV W/ CM
1 of 2 series · 15 of 32 positions shown, 19 images · IV contrast (omnipaque)
Comparison: 01/27/2015, 04/14/2009

CLINICAL DATA: Right lower quadrant abdominal pain, worsening
tonight.

EXAM:
CT ABDOMEN AND PELVIS WITH CONTRAST
TECHNIQUE: Multidetector CT imaging of the abdomen and pelvis was performed
using the standard protocol following bolus administration of
intravenous contrast.
CONTRAST:  125mL OMNIPAQUE IOHEXOL 300 MG/ML  SOLN

[Series 2: routine abd pel with · axial · 0.94mm/px · z∈[-498,-28]mm · 15 of 104 slices shown, 19 images]
[im 5/104  soft-tissue]
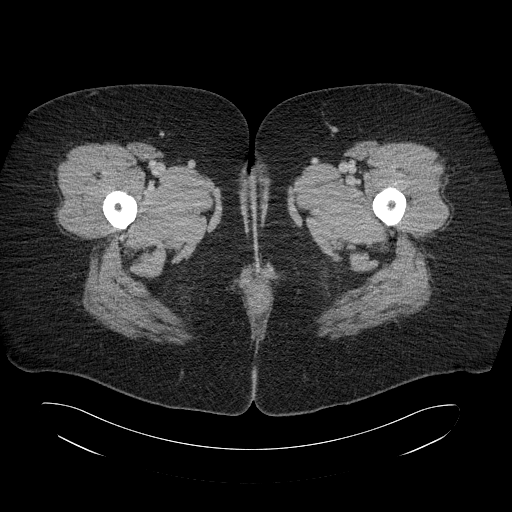
[im 5/104  bone]
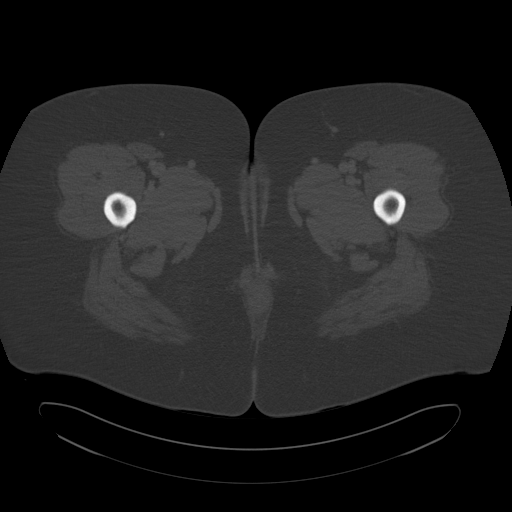
[im 13/104  soft-tissue]
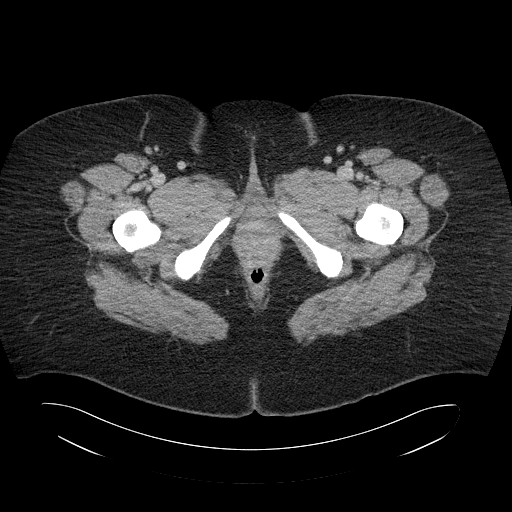
[im 21/104  soft-tissue]
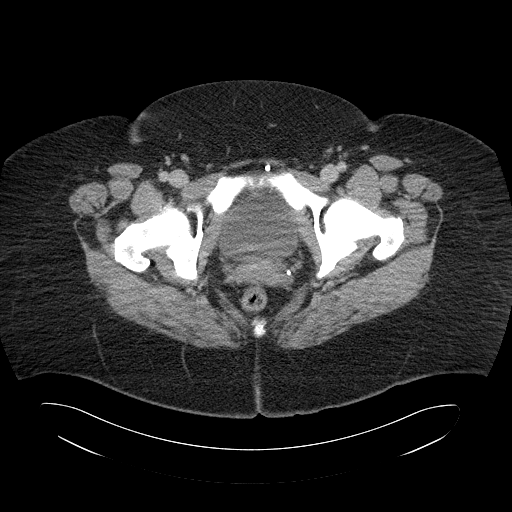
[im 29/104  soft-tissue]
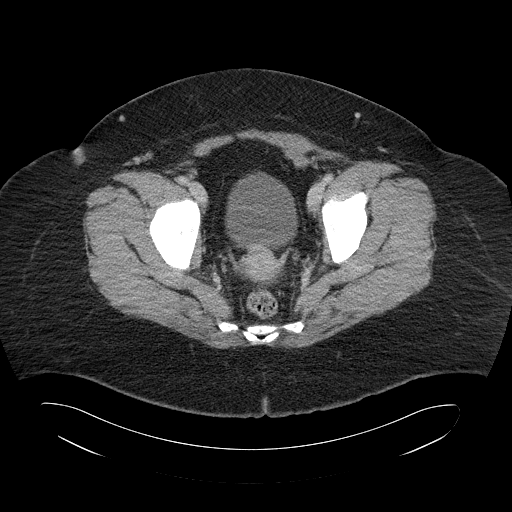
[im 38/104  soft-tissue]
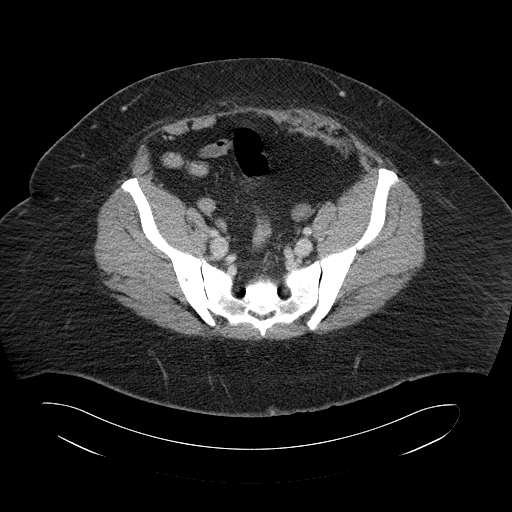
[im 46/104  soft-tissue]
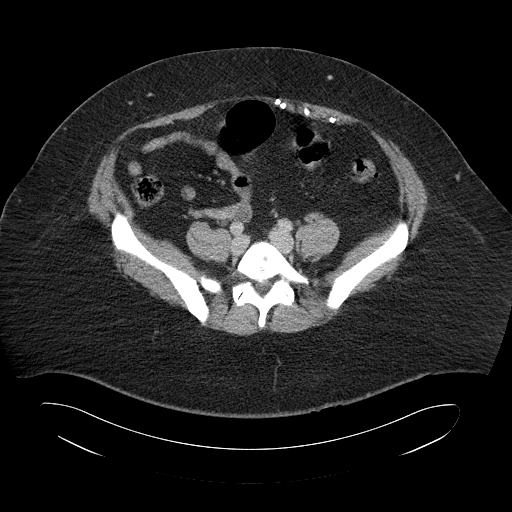
[im 54/104  soft-tissue]
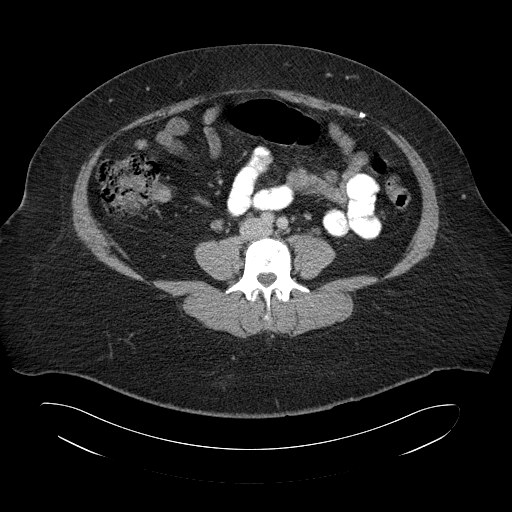
[im 58/104  soft-tissue]
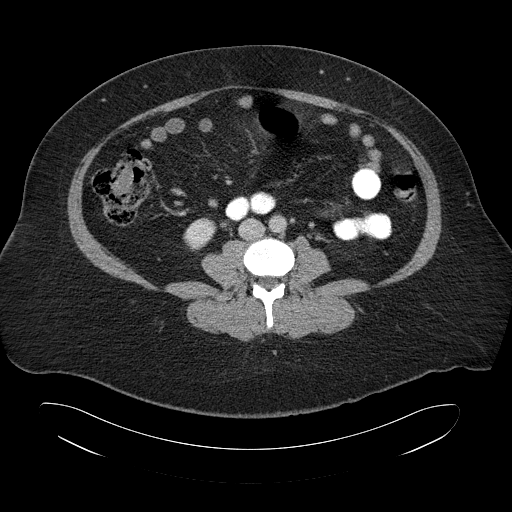
[im 66/104  soft-tissue]
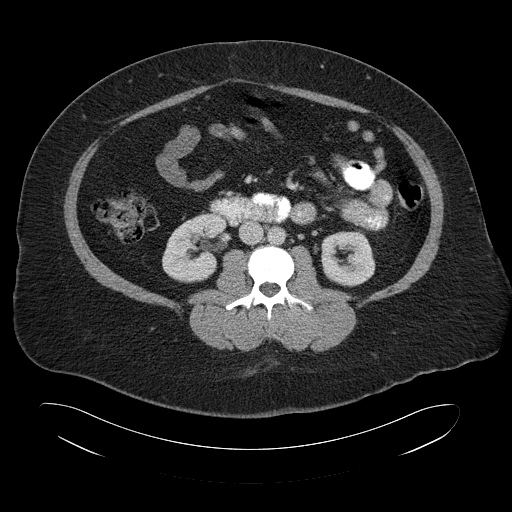
[im 66/104  bone]
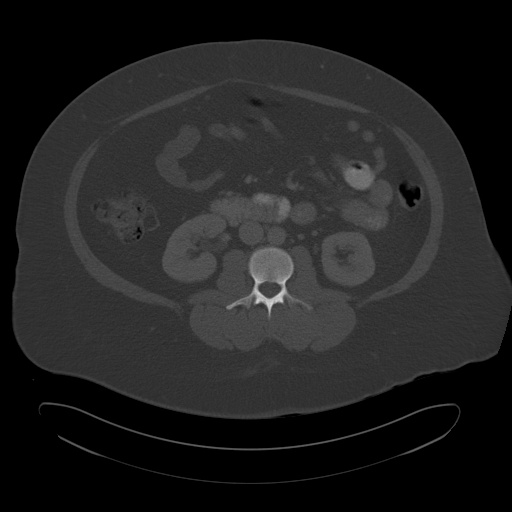
[im 75/104  soft-tissue]
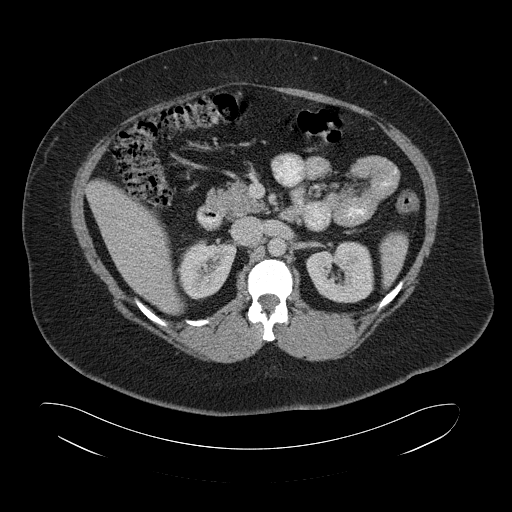
[im 83/104  soft-tissue]
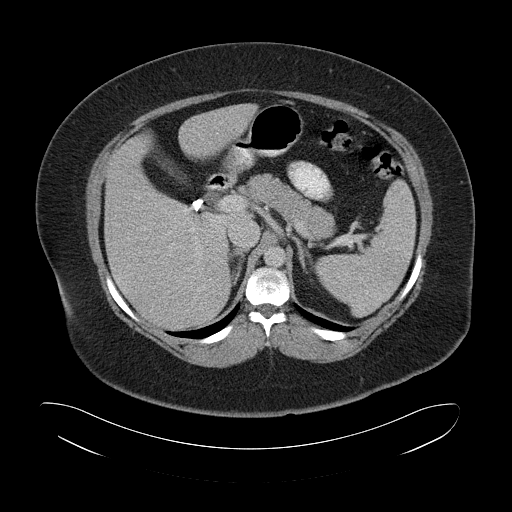
[im 87/104  lung]
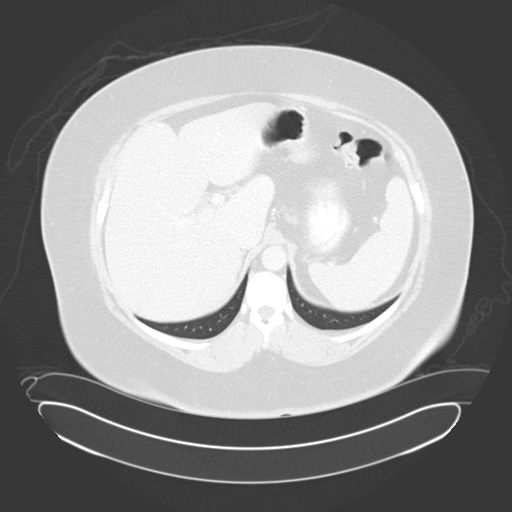
[im 91/104  soft-tissue]
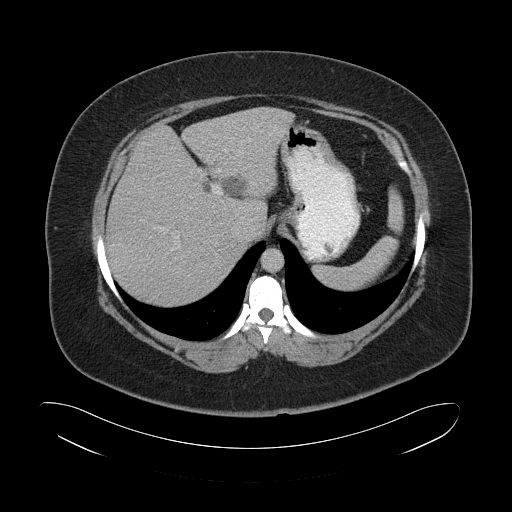
[im 91/104  lung]
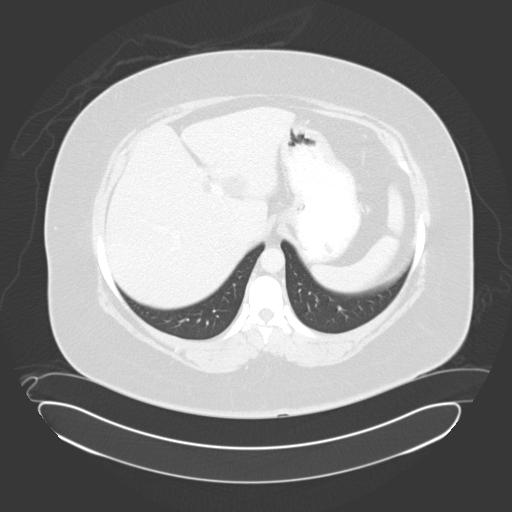
[im 95/104  lung]
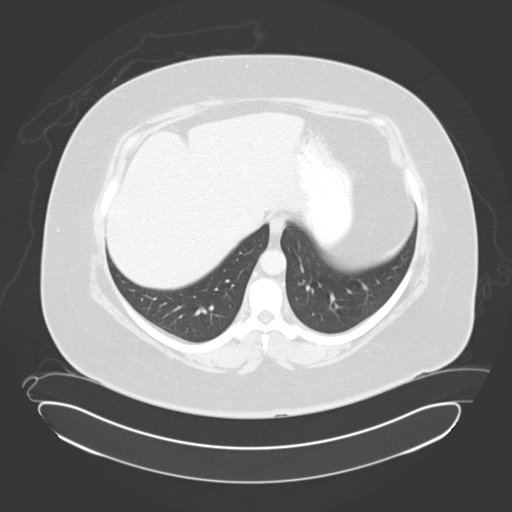
[im 99/104  soft-tissue]
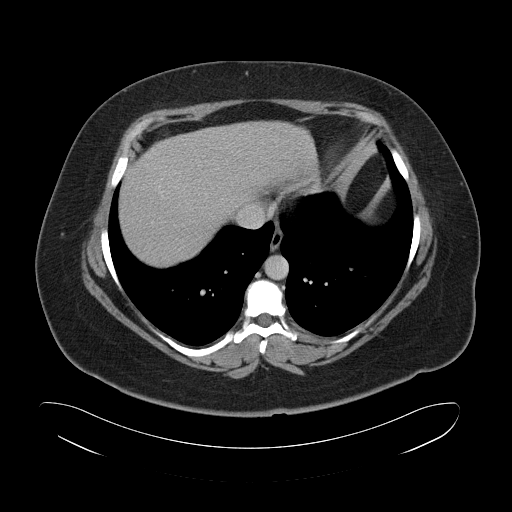
[im 99/104  lung]
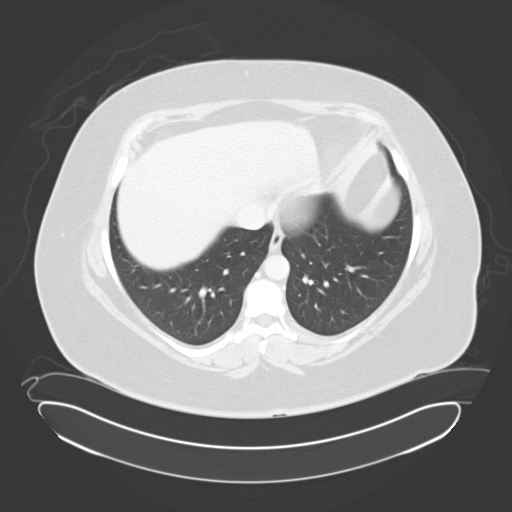

[15 of 32 positions shown; findings below may reference images not displayed]

FINDINGS: Lower chest:  Normal

Hepatobiliary: Cholecystectomy. 2.5 cm cyst at the posteromedial
left hepatic lobe, unchanged. Otherwise normal liver. Normal bile
ducts.

Pancreas: Normal

Spleen: Normal

Adrenals/Urinary Tract: The adrenals and kidneys are normal in
appearance. There is a 3 mm left collecting system calculus and a 2
mm right collecting system calculus, both lower pole. There are no
ureteral calculi. There is no hydronephrosis or ureteral dilatation.
Collecting systems and ureters appear unremarkable.

Stomach/Bowel: Stomach and small bowel are normal. There is mild
colonic diverticulosis. Appendix is normal.

Vascular/Lymphatic: The abdominal aorta is normal in caliber. There
is no atherosclerotic calcification. There is no adenopathy in the
abdomen or pelvis.

Reproductive: There is hysterectomy.  The ovaries are normal.

Other: No acute inflammatory changes are evident in the abdomen or
pelvis. There is no ascites.

Musculoskeletal: There is prior left inguinal herniorrhaphy. There
is no significant musculoskeletal lesion.
IMPRESSION: 1. No acute findings are evident.
2. Benign unchanged hepatic cyst
3. Nephrolithiasis
4. Normal appendix
5. Cholecystectomy and hysterectomy
